# Patient Record
Sex: Male | Born: 1960 | Hispanic: Yes | Marital: Married | Smoking: Never smoker
Health system: Southern US, Community
[De-identification: ages and names within clinical notes are randomized; demographics above are authoritative.]

## PROBLEM LIST (undated history)

## (undated) DIAGNOSIS — I10 Essential (primary) hypertension: Secondary | ICD-10-CM

## (undated) DIAGNOSIS — G4730 Sleep apnea, unspecified: Secondary | ICD-10-CM

## (undated) DIAGNOSIS — R0981 Nasal congestion: Secondary | ICD-10-CM

## (undated) DIAGNOSIS — R030 Elevated blood-pressure reading, without diagnosis of hypertension: Principal | ICD-10-CM

## (undated) DIAGNOSIS — IMO0001 Reserved for inherently not codable concepts without codable children: Secondary | ICD-10-CM

## (undated) DIAGNOSIS — I639 Cerebral infarction, unspecified: Secondary | ICD-10-CM

## (undated) HISTORY — PX: COLONOSCOPY: SHX174

## (undated) HISTORY — DX: Reserved for inherently not codable concepts without codable children: IMO0001

## (undated) HISTORY — PX: HERNIA REPAIR: SHX51

## (undated) HISTORY — DX: Cerebral infarction, unspecified: I63.9

## (undated) HISTORY — DX: Elevated blood-pressure reading, without diagnosis of hypertension: R03.0

---

## 2005-01-28 ENCOUNTER — Emergency Department (HOSPITAL_COMMUNITY): Admission: EM | Admit: 2005-01-28 | Discharge: 2005-01-28 | Payer: Self-pay | Admitting: Emergency Medicine

## 2005-01-28 IMAGING — CT CT HEAD W/O CM
1 of 2 series · 14 of 30 positions shown, 18 images · IV contrast (agent unspecified)
Comparison: None

CLINICAL DATA: Fever, headache, nausea, vomiting

HEAD CT WITHOUT CONTRAST:
TECHNIQUE: 5mm collimated images were obtained from the base of the skull
through the vertex according to standard protocol without contrast.

[Series 2: brain · axial · 0.47mm/px · z∈[+149,+293]mm · 14 of 36 slices shown, 18 images]
[im 3/36  brain]
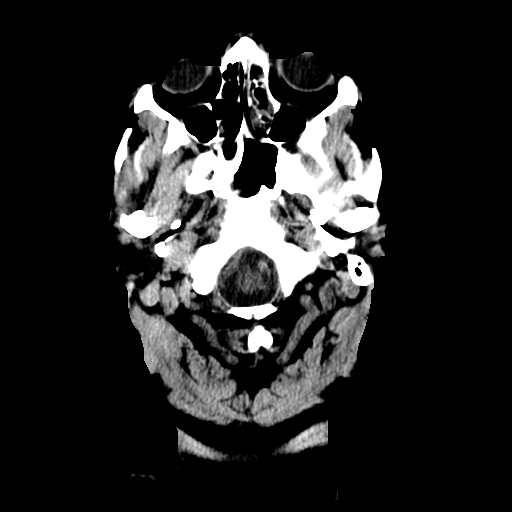
[im 3/36  bone]
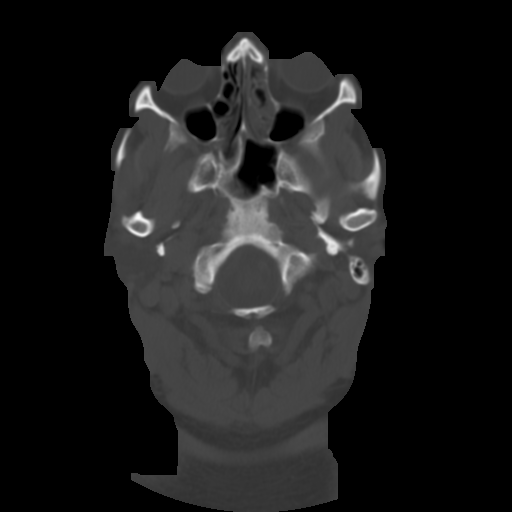
[im 5/36  brain]
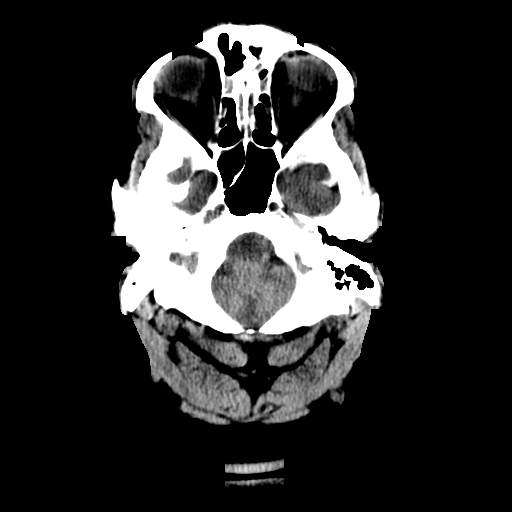
[im 8/36  brain]
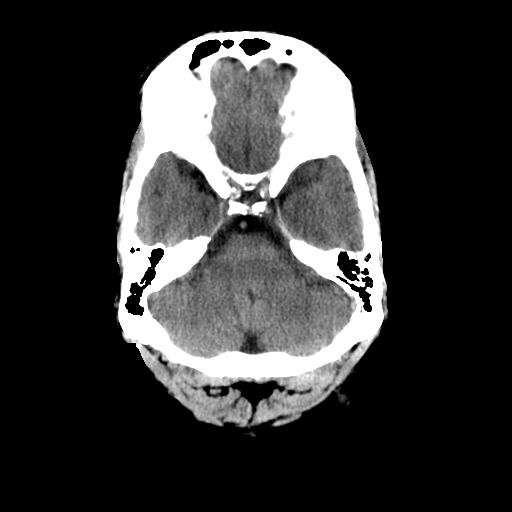
[im 10/36  brain]
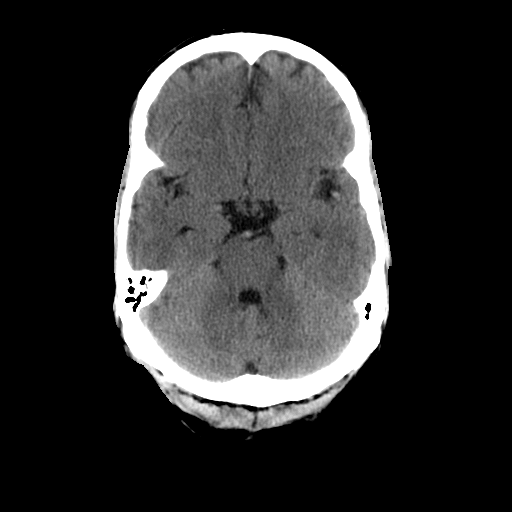
[im 12/36  brain]
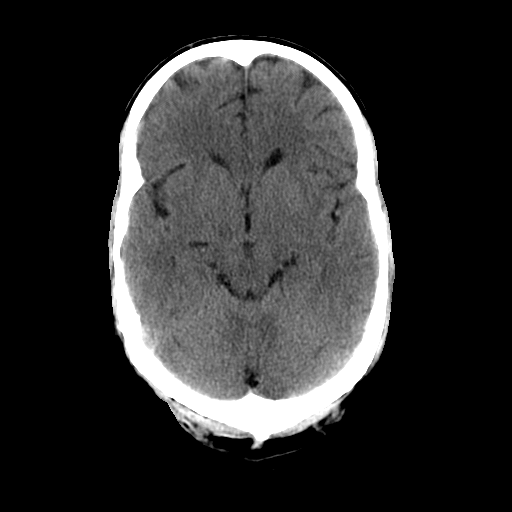
[im 12/36  bone]
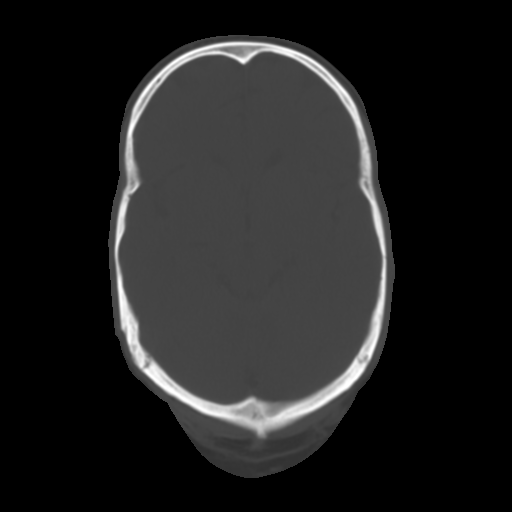
[im 15/36  brain]
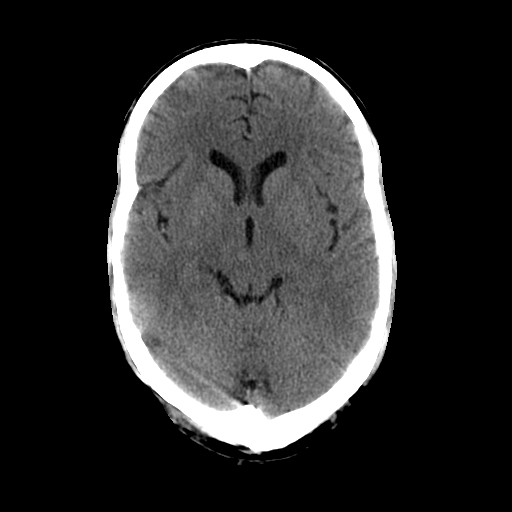
[im 17/36  brain]
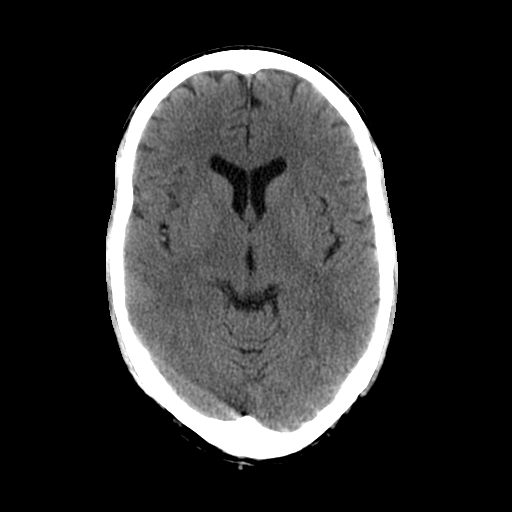
[im 19/36  brain]
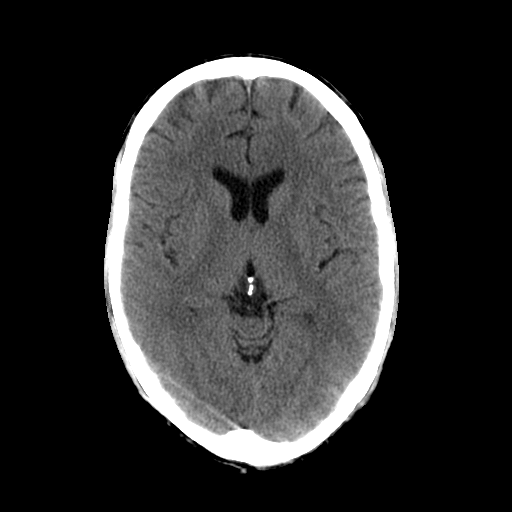
[im 22/36  brain]
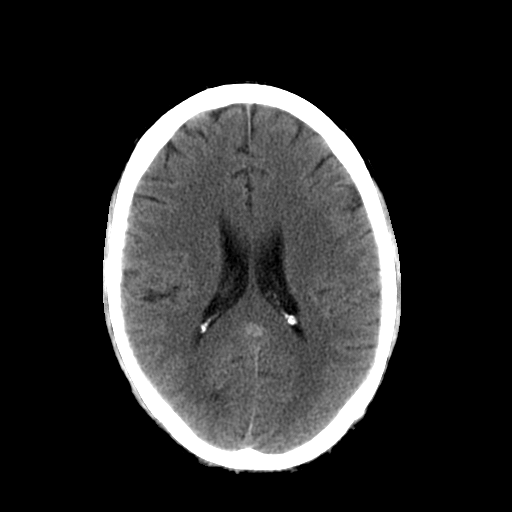
[im 22/36  bone]
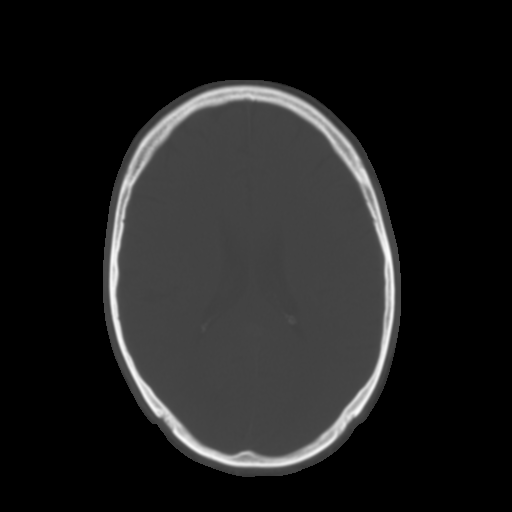
[im 24/36  brain]
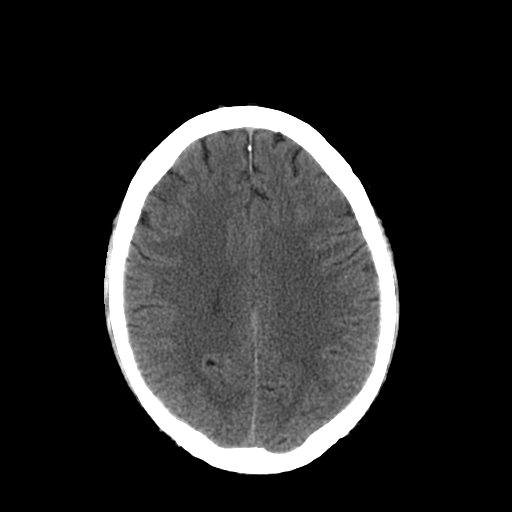
[im 26/36  brain]
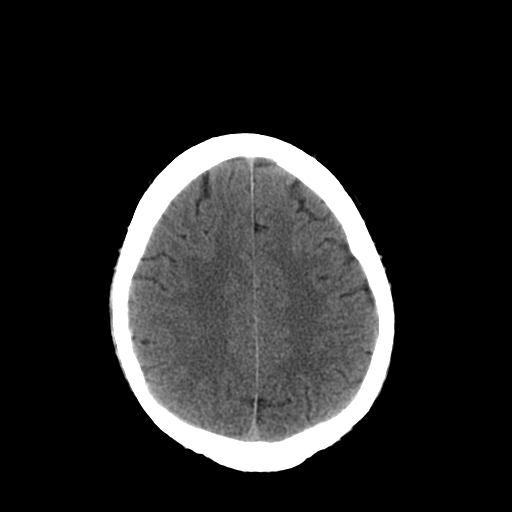
[im 29/36  brain]
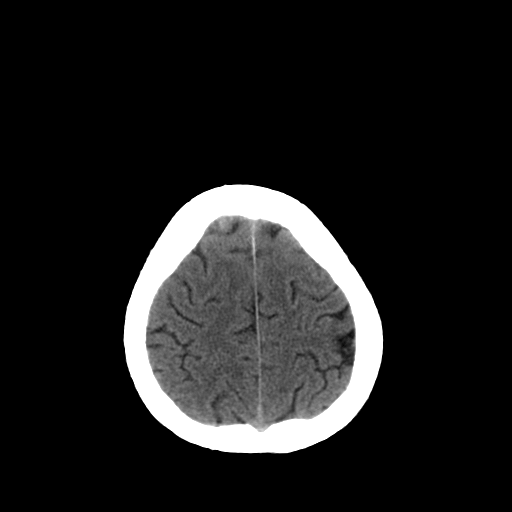
[im 31/36  brain]
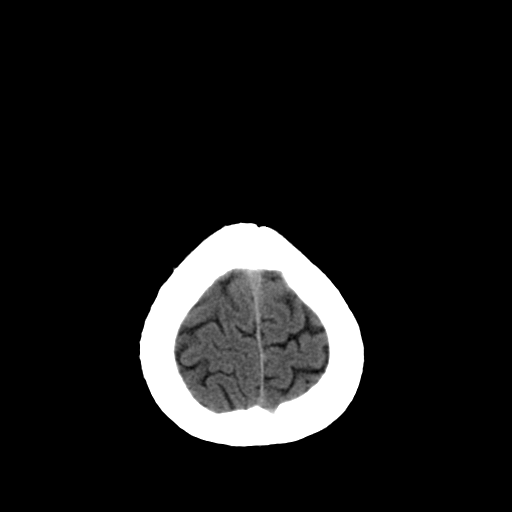
[im 31/36  bone]
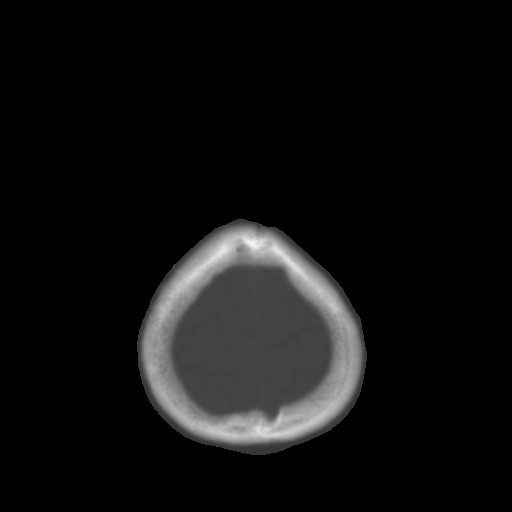
[im 33/36  brain]
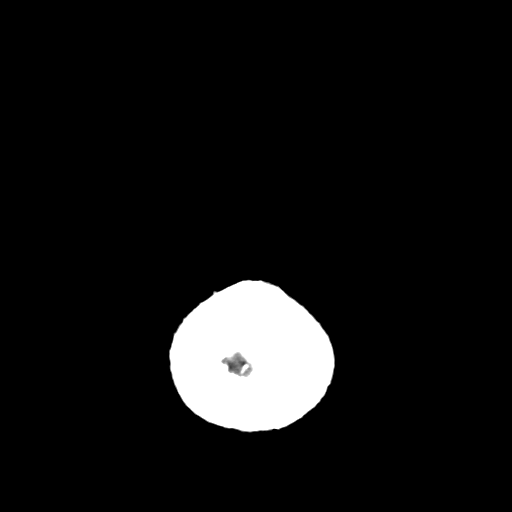

[14 of 30 positions shown; findings below may reference images not displayed]

FINDINGS: There is no evidence of intracranial hemorrhage, brain edema, or mass
effect.  No other intra-axial abnormalities are seen, and the ventricles are
within normal limits.  No abnormal extra-axial fluid collections or masses are
identified.  No skull abnormalities are noted.

Chronic sinusitis changes are noted in the visualized para-nasal sinuses.
IMPRESSION: No intracranial abnormality.

Chronic sinusitis.

## 2009-08-09 ENCOUNTER — Emergency Department (HOSPITAL_COMMUNITY): Admission: EM | Admit: 2009-08-09 | Discharge: 2009-08-09 | Payer: Self-pay | Admitting: Emergency Medicine

## 2009-08-09 IMAGING — CR DG FOOT COMPLETE 3+V*L*
3 series · 3 of 3 positions shown · non-contrast
Comparison: None.

CLINICAL DATA: Tree fell on the left foot today.  Pain in left
first through third toes.

LEFT FOOT - COMPLETE 3+ VIEW

[t foot ap left]
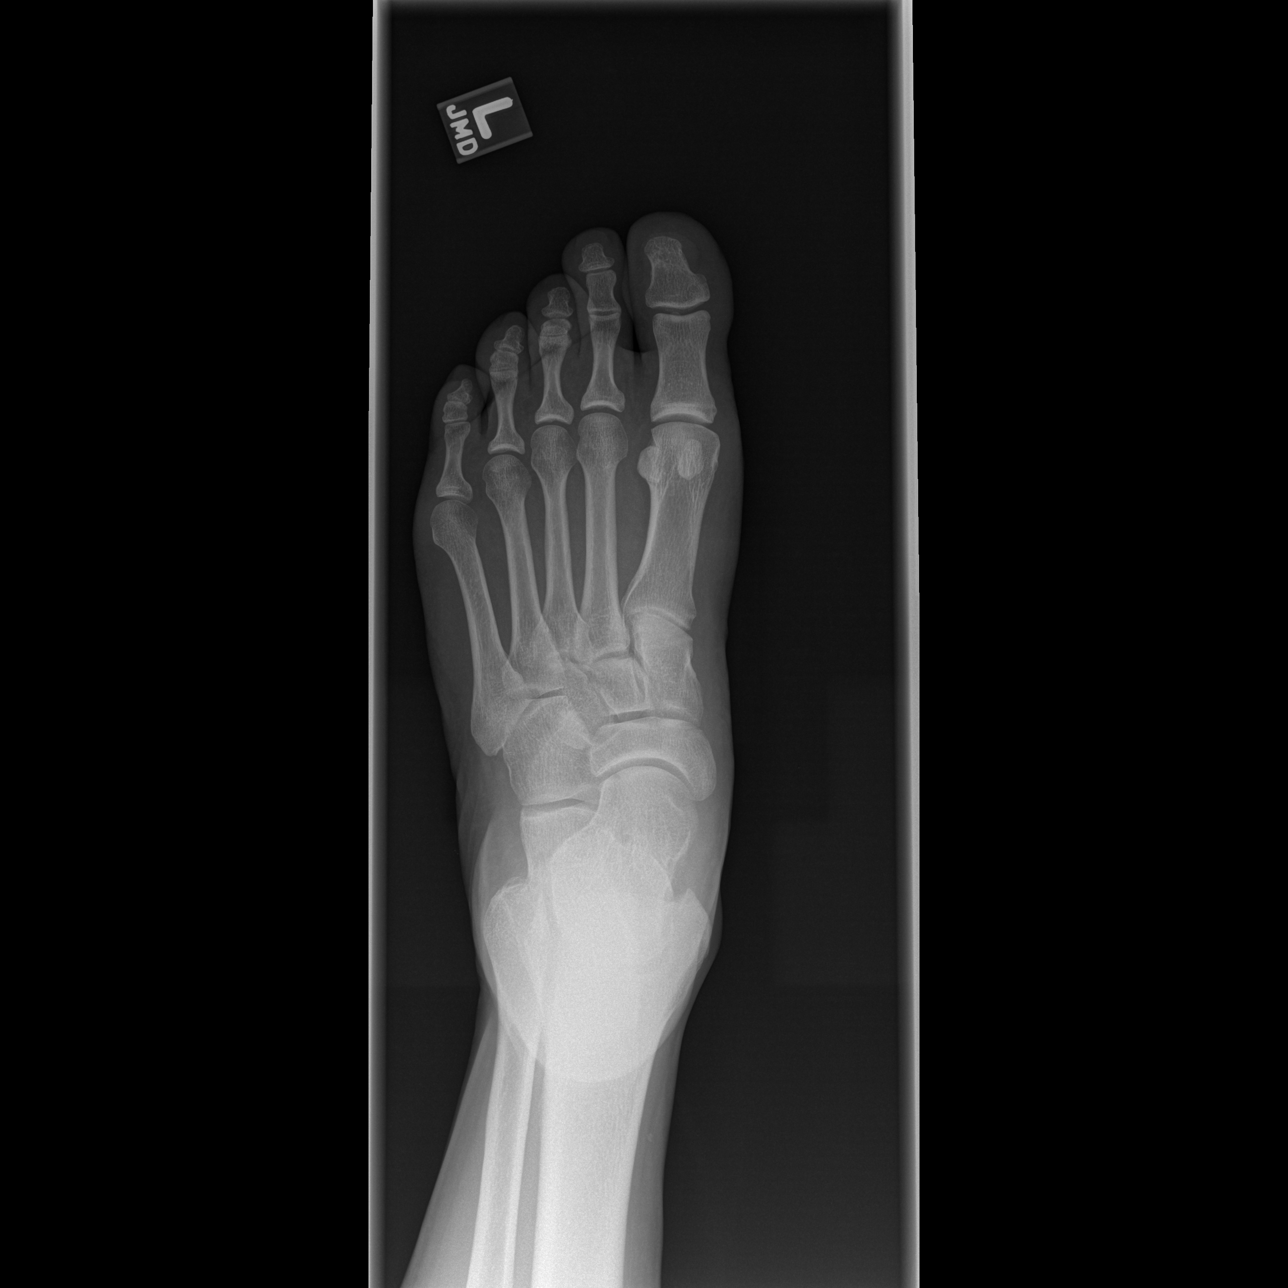

[t foot oblique left]
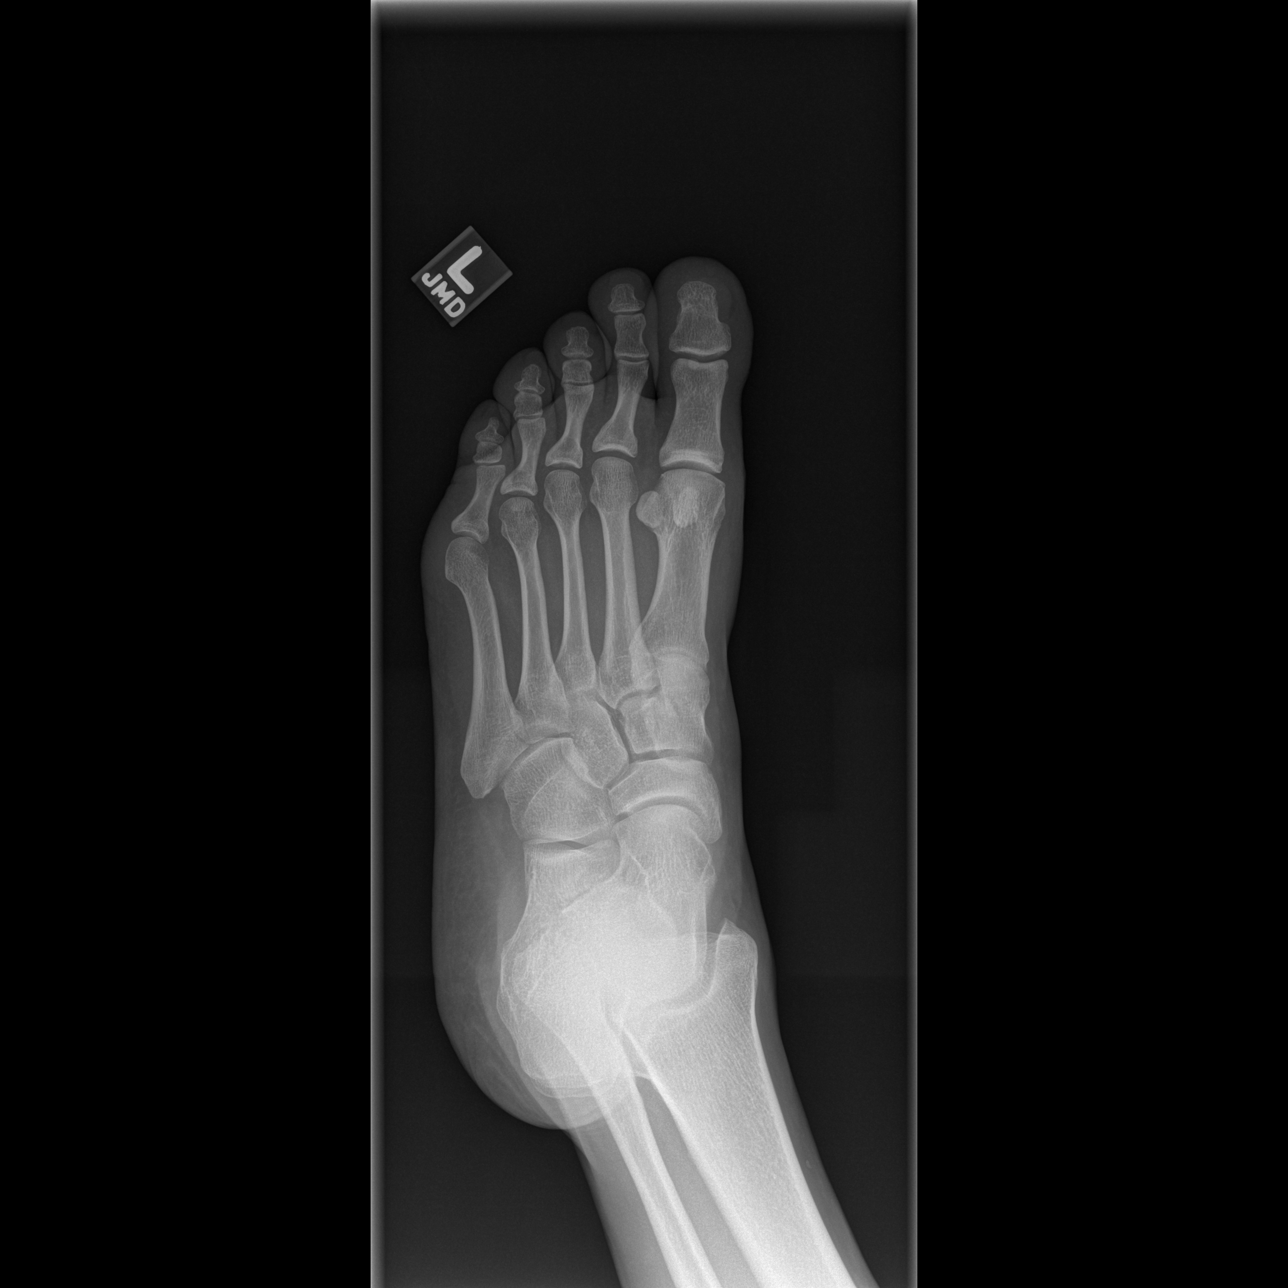

[t foot lat left]
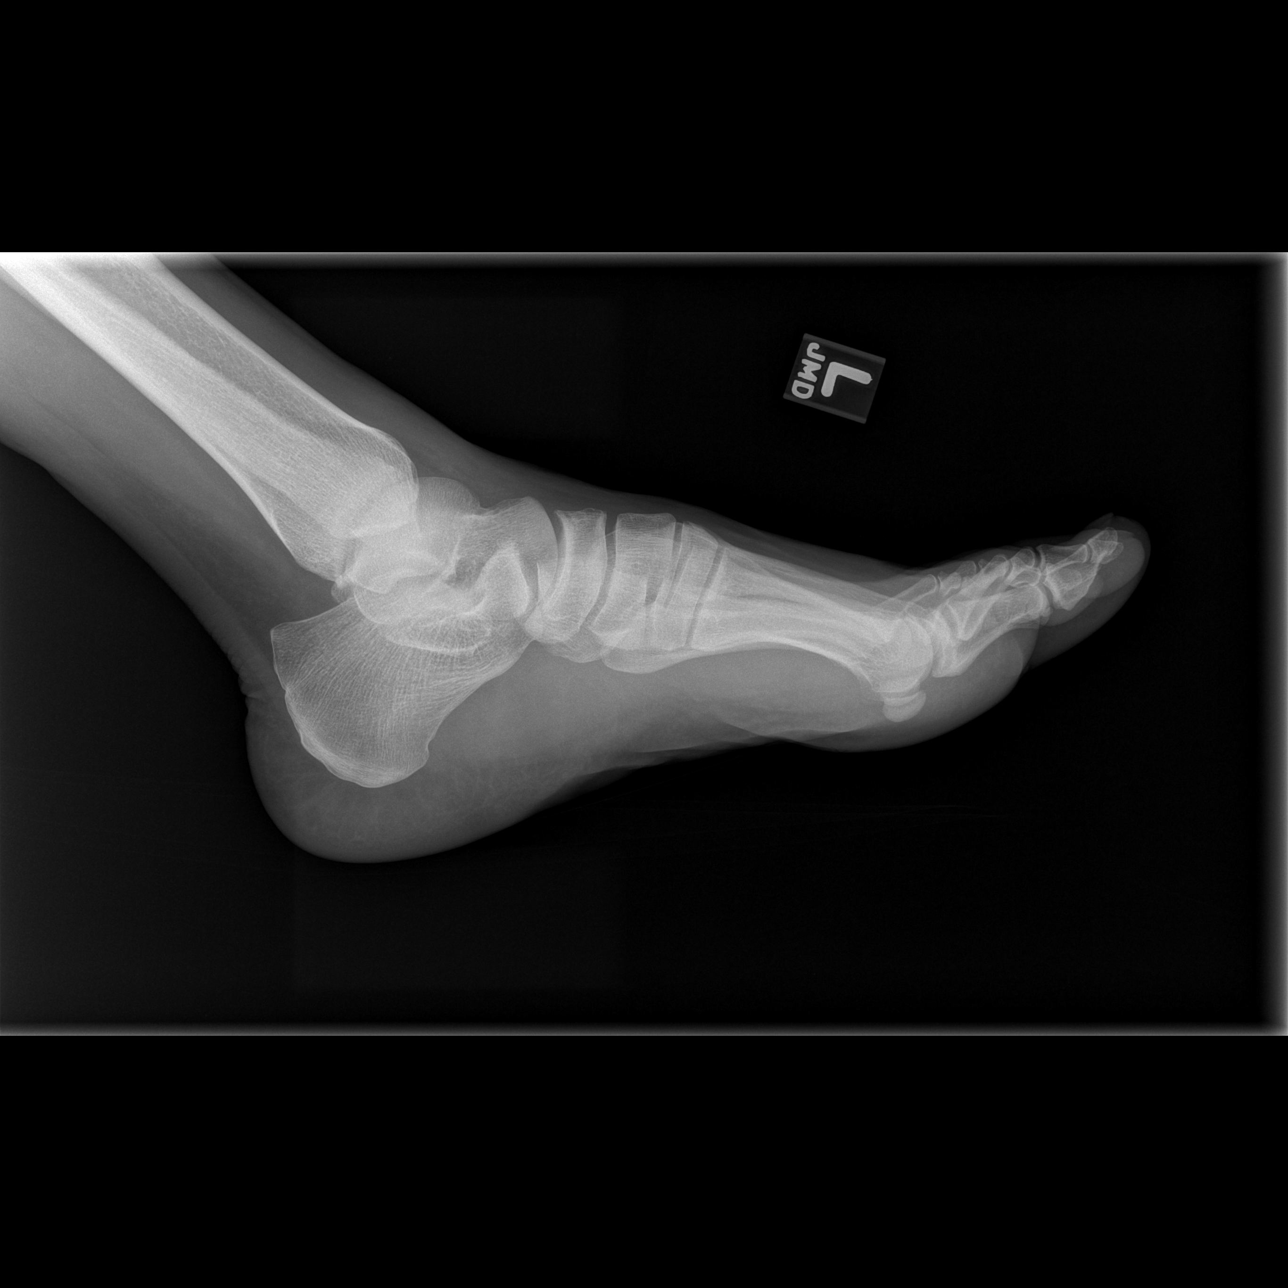

[3 of 3 positions shown; findings below may reference images not displayed]

FINDINGS: There is no evidence of fracture or dislocation.  There
is no evidence of arthropathy or other focal bony abnormality.
Soft tissues are unremarkable.
IMPRESSION: Negative.

## 2010-07-29 ENCOUNTER — Emergency Department (HOSPITAL_COMMUNITY)
Admission: EM | Admit: 2010-07-29 | Discharge: 2010-07-29 | Disposition: A | Discharge status: Home / Self Care | Payer: BC Managed Care – PPO | Attending: Emergency Medicine | Admitting: Emergency Medicine

## 2010-07-29 DIAGNOSIS — M5430 Sciatica, unspecified side: Secondary | ICD-10-CM | POA: Insufficient documentation

## 2010-07-29 DIAGNOSIS — M545 Low back pain, unspecified: Secondary | ICD-10-CM | POA: Insufficient documentation

## 2010-07-29 DIAGNOSIS — X500XXA Overexertion from strenuous movement or load, initial encounter: Secondary | ICD-10-CM | POA: Insufficient documentation

## 2010-07-31 ENCOUNTER — Encounter (INDEPENDENT_AMBULATORY_CARE_PROVIDER_SITE_OTHER): Payer: Self-pay | Admitting: *Deleted

## 2010-07-31 ENCOUNTER — Encounter: Payer: Self-pay | Admitting: Internal Medicine

## 2010-07-31 ENCOUNTER — Ambulatory Visit (INDEPENDENT_AMBULATORY_CARE_PROVIDER_SITE_OTHER): Payer: BC Managed Care – PPO | Admitting: Internal Medicine

## 2010-07-31 VITALS — BP 144/80 | HR 78 | Ht 67.25 in | Wt 161.6 lb

## 2010-07-31 DIAGNOSIS — M549 Dorsalgia, unspecified: Secondary | ICD-10-CM

## 2010-07-31 MED ORDER — KETOROLAC TROMETHAMINE 60 MG/2ML IJ SOLN: 60.0000 mg | INTRAMUSCULAR | Status: DC

## 2010-07-31 MED ORDER — ORPHENADRINE CITRATE 100 MG PO TB12: 100.0000 mg | ORAL_TABLET | Freq: Two times a day (BID) | ORAL | Status: DC

## 2010-07-31 MED ORDER — KETOROLAC TROMETHAMINE 60 MG/2ML IM SOLN
60.0000 mg | Freq: Once | INTRAMUSCULAR | Status: AC
  Administered 2010-07-31: 60 mg via INTRAMUSCULAR

## 2010-07-31 MED ORDER — OXYCODONE-ACETAMINOPHEN 5-325 MG PO TABS: 2.0000 | ORAL_TABLET | Freq: Four times a day (QID) | ORAL | Status: DC | PRN

## 2010-07-31 NOTE — Patient Instructions (Signed)
compresas calinentes en la [employer] medicinas tal como estan prescritas llame si se pone peor , tiene fiebre, no siente la pierna llame si no mejora en [employer]

## 2010-07-31 NOTE — Assessment & Plan Note (Addendum)
One-week history of left lower back pain which radiculopathy features. Neurological exam negative except for a positive straight leg test. Plan: Toradol Finish prednisone Warm compresses RF pain meds and muscle relaxants Referred to orthopedic surgery Will call if no better in one week or if he has more symptoms

## 2010-07-31 NOTE — Progress Notes (Signed)
  Subjective:    Patient ID: Manuel Peters, male    DOB: [birth year], 50 y.o.   MRN: [id number]  HPI New patient Was seen at the emergency room 3-28, complained of back pain, was prescribed prednisone . Pain started ~ 1 week ago, sudden onset while working in his yard Goes from the L Low back to the left leg, posteriorly. Meds help temporarily  No past medical history on file.  No past surgical history on file.   Review of Systems No fever, no rash Note nausea, vomiting, diarrhea No bladder or bowel incontinence Occasional tingling in the left leg on and off No history of previous back injuries No dysuria or gross hematuria    Objective:   Physical Exam Alert and oriented, some distress when he lays down in the table to be examined at Lungs are clear to auscultation bilateral CV: RRR without a murmur , extremities no edema Neurological exam: DTRs normal bilaterally, strength normal bilaterally. Straight leg test positive on the left.        Assessment & Plan:  Evaluated for back pain today. Notes from the ER reviewed  Will come back in one month for a physical exam

## 2010-08-04 NOTE — Letter (Signed)
Summary: Out of Work  [facility]  [address] [place], [place] [address]   Phone: [phone]  Fax: [phone]    July 31, 2010   Employee:  Manuel Peters    To Whom It May Concern:   For Medical reasons, please excuse the above named employee from work for the following dates:  Start:   07/29/10  End:   08/10/10  If you need additional information, please feel free to contact our office.         Sincerely,    [name], MD

## 2010-08-17 ENCOUNTER — Telehealth: Payer: Self-pay | Admitting: *Deleted

## 2010-08-17 NOTE — Telephone Encounter (Signed)
Pt needs a note to return to work- he was written out from 07/30/10-08/10/10. He is just now trying to go back to work today. Please advise if it is okay to give him an additional week- 4/9-4/16 for his work.

## 2010-08-20 ENCOUNTER — Encounter: Payer: Self-pay | Admitting: *Deleted

## 2010-08-20 NOTE — Telephone Encounter (Signed)
Message left for patient to return my call. Letter is upfront for pt.

## 2010-08-20 NOTE — Telephone Encounter (Signed)
Ok to do

## 2010-08-21 NOTE — Telephone Encounter (Signed)
Message left for patient to return my call.  

## 2010-08-31 ENCOUNTER — Ambulatory Visit (INDEPENDENT_AMBULATORY_CARE_PROVIDER_SITE_OTHER): Payer: BC Managed Care – PPO | Admitting: Internal Medicine

## 2010-08-31 ENCOUNTER — Encounter: Payer: Self-pay | Admitting: Internal Medicine

## 2010-08-31 DIAGNOSIS — M549 Dorsalgia, unspecified: Secondary | ICD-10-CM

## 2010-08-31 DIAGNOSIS — R030 Elevated blood-pressure reading, without diagnosis of hypertension: Secondary | ICD-10-CM

## 2010-08-31 NOTE — Assessment & Plan Note (Signed)
Essentially resolved. Will call if symptoms resurface

## 2010-08-31 NOTE — Progress Notes (Signed)
  Subjective:    Patient ID: Manuel Peters, male    DOB: 1960-12-25, 50 y.o.   MRN: [id number]  HPI  Was seen recently with back pain, was referred to orthopedic surgery but that never happened. 2 to 3 days after the last visit, he improved. Currently doing well, essentially no back pain. From time to time he feels some mild tingling in the left leg. Also, complaining of elevated blood pressure from time to time. He is ambulatory his are sometimes 130/90.  Past Medical History  Diagnosis Date  . Elevated BP    Past Surgical History  Procedure Date  . Hernia repair 1980s    inguinal    History   Social History  . Marital Status: Single    Spouse Name: N/A    Number of Children: N/A  . Years of Education: N/A   Occupational History  . Not on file.   Social History Main Topics  . Smoking status: Never Smoker   . Smokeless tobacco: Not on file  . Alcohol Use: Yes     rarely  . Drug Use: Not on file  . Sexually Active: Not on file   Other Topics Concern  . Not on file   Social History Narrative   Original from [place]    Review of Systems No fever, no bladder or bowel incontinence. He had some constipation while he was taking pain medicine but that is resolved. He has a healthy lifestyle, he is active, follows a low-fat diet soft diet. + loud  snoring      Objective:   Physical Exam  Constitutional: He is oriented to person, place, and time. He appears well-developed and well-nourished.  Cardiovascular: Normal rate, regular rhythm, normal heart sounds and intact distal pulses.   Pulmonary/Chest: Effort normal and breath sounds normal.  Musculoskeletal:       Not tender to palpation in the back  Neurological: He is alert and oriented to person, place, and time.       Lower extremity reflexes and strength symmetric. Negative straight leg test  Psychiatric: He has a normal mood and affect.          Assessment & Plan:

## 2010-08-31 NOTE — Assessment & Plan Note (Signed)
Moderately elevated BP, recommend to continue with his healthy life and if possible to improve it and lose some weight. Will ask the patient to monitor his BP regularly and call if it is elevated. Instructions provided in Spanish.

## 2010-08-31 NOTE — Patient Instructions (Signed)
Tome la presion 3 veces por semana, si esta mas de 140/85 la [employer] del tiempo: llamenos para una cita. regrese en 4 meses para un examen general

## 2013-04-20 ENCOUNTER — Encounter (HOSPITAL_BASED_OUTPATIENT_CLINIC_OR_DEPARTMENT_OTHER): Payer: Self-pay | Admitting: Emergency Medicine

## 2013-04-20 ENCOUNTER — Emergency Department (HOSPITAL_BASED_OUTPATIENT_CLINIC_OR_DEPARTMENT_OTHER)
Admission: EM | Admit: 2013-04-20 | Discharge: 2013-04-21 | Disposition: A | Discharge status: Home / Self Care | Payer: BC Managed Care – PPO | Attending: Emergency Medicine | Admitting: Emergency Medicine

## 2013-04-20 DIAGNOSIS — Z79899 Other long term (current) drug therapy: Secondary | ICD-10-CM | POA: Insufficient documentation

## 2013-04-20 DIAGNOSIS — E860 Dehydration: Secondary | ICD-10-CM

## 2013-04-20 DIAGNOSIS — I10 Essential (primary) hypertension: Secondary | ICD-10-CM | POA: Insufficient documentation

## 2013-04-20 DIAGNOSIS — Z7982 Long term (current) use of aspirin: Secondary | ICD-10-CM | POA: Insufficient documentation

## 2013-04-20 DIAGNOSIS — M6282 Rhabdomyolysis: Secondary | ICD-10-CM | POA: Insufficient documentation

## 2013-04-20 NOTE — ED Notes (Signed)
Pt states that about 30 minutes ago he was sleeping and woke up with sudden onset severe muscle cramping all over his body.  Pt recently started a new blood pressure medication and also states that he was out side working in the cold weather.

## 2013-04-21 LAB — COMPREHENSIVE METABOLIC PANEL
ALT: 23 U/L (ref 0–53)
Albumin: 3.9 g/dL (ref 3.5–5.2)
Alkaline Phosphatase: 49 U/L (ref 39–117)
BUN: 29 mg/dL — ABNORMAL HIGH (ref 6–23)
CO2: 28 mEq/L (ref 19–32)
Chloride: 100 mEq/L (ref 96–112)
GFR calc Af Amer: 70 mL/min — ABNORMAL LOW (ref >90)
Sodium: 141 mEq/L (ref 135–145)
Total Bilirubin: 0.6 mg/dL (ref 0.3–1.2)

## 2013-04-21 LAB — CBC WITH DIFFERENTIAL/PLATELET
Eosinophils Absolute: 0.2 10*3/uL (ref 0.0–0.7)
Eosinophils Relative: 2 % (ref 0–5)
Hemoglobin: 14 g/dL (ref 13.0–17.0)
Lymphs Abs: 1.7 10*3/uL (ref 0.7–4.0)
MCV: 85.7 fL (ref 78.0–100.0)
Monocytes Absolute: 0.9 10*3/uL (ref 0.1–1.0)
Neutrophils Relative %: 55 % (ref 43–77)
WBC: 6.2 10*3/uL (ref 4.0–10.5)

## 2013-04-21 MED ORDER — SODIUM CHLORIDE 0.9 % IV BOLUS (SEPSIS)
1000.0000 mL | Freq: Once | INTRAVENOUS | Status: AC
  Administered 2013-04-21: 1000 mL via INTRAVENOUS

## 2013-04-21 NOTE — ED Provider Notes (Signed)
CSN: [id number]     Arrival date & time 04/20/13  2328 History   First MD Initiated Contact with Patient 04/21/13 0009     Chief Complaint  Patient presents with  . Muscle Pain   (Consider location/radiation/quality/duration/timing/severity/associated sxs/prior Treatment) HPI Comments: Pt with hx of HTN, just started on Lis/HCTz combo 2 days ago, comes in with cc of myalgias. 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 Patient now pain free. Pt has no hx of similar sx. He deneis any n/v/f/c/diarrhea or current URI like sx.  Patient is a 52 y.o. male presenting with musculoskeletal pain. The history is provided by the patient and the spouse.  Muscle Pain Pertinent negatives include no chest pain, no headaches and no shortness of breath.    Past Medical History  Diagnosis Date  . Elevated BP    Past Surgical History  Procedure Laterality Date  . Hernia repair  1980s    inguinal    Family History  Problem Relation Age of Onset  . Hypertension      M, B   History  Substance Use Topics  . Smoking status: Never Smoker   . Smokeless tobacco: Never Used  . Alcohol Use: Yes     Comment: rarely    Review of Systems  Constitutional: Negative for fever, chills and activity change.  Eyes: Negative for visual disturbance.  Respiratory: Negative for cough, chest tightness and shortness of breath.   Cardiovascular: Negative for chest pain.  Gastrointestinal: Negative for abdominal distention.  Genitourinary: Negative for dysuria, enuresis and difficulty urinating.  Musculoskeletal: Positive for myalgias. Negative for arthralgias and neck pain.  Neurological: Negative for dizziness, light-headedness and headaches.  Psychiatric/Behavioral: Negative for confusion.    Allergies  Review of patient's allergies indicates no known  allergies.  Home Medications   Current Outpatient Rx  Name  Route  Sig  Dispense  Refill  . lisinopril-hydrochlorothiazide (PRINZIDE,ZESTORETIC) 20-25 MG per tablet   Oral   Take 1 tablet by mouth daily.         Marland Kitchen aspirin 81 MG tablet   Oral   Take 81 mg by mouth daily.            BP 140/92  Pulse 73  Temp(Src) 98.3 F (36.8 C) (Oral)  Resp 16  Ht 5\' 7"  (1.702 m)  Wt 170 lb (77.111 kg)  BMI 26.62 kg/m2  SpO2 96% Physical Exam  Constitutional: He is oriented to person, place, and time. He appears well-developed.  HENT:  Head: Normocephalic and atraumatic.  Eyes: Conjunctivae and EOM are normal. Pupils are equal, round, and reactive to light.  Neck: Normal range of motion. Neck supple.  Cardiovascular: Normal rate and regular rhythm.   Pulmonary/Chest: Effort normal and breath sounds normal.  Abdominal: Soft. Bowel sounds are normal. He exhibits no distension. There is no tenderness. There is no rebound and no guarding.  Musculoskeletal: Normal range of motion. He exhibits no edema and no tenderness.  Neurological: He is alert and oriented to person, place, and time.  Skin: Skin is warm.    ED Course  Procedures (including critical care time) Labs Review Labs Reviewed  CBC WITH DIFFERENTIAL - Abnormal; Notable for the following:    Monocytes Relative 14 (*)    All other components within normal limits  CK - Abnormal; Notable for  the following:    Total CK 455 (*)    All other components within normal limits  COMPREHENSIVE METABOLIC PANEL - Abnormal; Notable for the following:    BUN 29 (*)    GFR calc non Af Amer 61 (*)    GFR calc Af Amer 70 (*)    All other components within normal limits  TROPONIN I   Imaging Review No results found.  EKG Interpretation   None       MDM  No diagnosis found.  Pt comes in with cc of myalgias - crampy pain, diffuse, for < 10 minutes. Patient started on new meds - side effect profile checked - and besides  electrlyte imbalance, and dehydration - nothing really coming up that could be related to his sx. CPK is slightly elevated Pt admits to not drinking much fluids during work. Will hydrate. He is symptoms free outside of that 1 episode, and is stable for discharge.    Derwood Kaplan, MD 04/21/13 [phone]

## 2014-06-20 DIAGNOSIS — J309 Allergic rhinitis, unspecified: Secondary | ICD-10-CM | POA: Insufficient documentation

## 2014-06-20 DIAGNOSIS — I10 Essential (primary) hypertension: Secondary | ICD-10-CM | POA: Insufficient documentation

## 2014-07-10 ENCOUNTER — Encounter (HOSPITAL_BASED_OUTPATIENT_CLINIC_OR_DEPARTMENT_OTHER): Payer: Self-pay | Admitting: *Deleted

## 2014-07-10 NOTE — Progress Notes (Signed)
Wife has cell phone-states pt does not need interpreter-he can understand-daughter also coming-trying to get notes from dr valesquez-will need istat-ekg Added on 430pm

## 2014-07-11 ENCOUNTER — Ambulatory Visit (HOSPITAL_BASED_OUTPATIENT_CLINIC_OR_DEPARTMENT_OTHER): Payer: 59 | Admitting: Anesthesiology

## 2014-07-11 ENCOUNTER — Ambulatory Visit (HOSPITAL_BASED_OUTPATIENT_CLINIC_OR_DEPARTMENT_OTHER)
Admission: RE | Admit: 2014-07-11 | Discharge: 2014-07-11 | Disposition: A | Discharge status: Home / Self Care | Payer: 59 | Source: Ambulatory Visit | Attending: Otolaryngology | Admitting: Otolaryngology

## 2014-07-11 ENCOUNTER — Encounter (HOSPITAL_BASED_OUTPATIENT_CLINIC_OR_DEPARTMENT_OTHER)
Admission: RE | Disposition: A | Discharge status: Home / Self Care | Payer: Self-pay | Source: Ambulatory Visit | Attending: Otolaryngology

## 2014-07-11 ENCOUNTER — Encounter (HOSPITAL_BASED_OUTPATIENT_CLINIC_OR_DEPARTMENT_OTHER): Payer: Self-pay | Admitting: *Deleted

## 2014-07-11 DIAGNOSIS — J343 Hypertrophy of nasal turbinates: Secondary | ICD-10-CM | POA: Diagnosis not present

## 2014-07-11 DIAGNOSIS — G4730 Sleep apnea, unspecified: Secondary | ICD-10-CM | POA: Insufficient documentation

## 2014-07-11 DIAGNOSIS — J338 Other polyp of sinus: Secondary | ICD-10-CM | POA: Insufficient documentation

## 2014-07-11 DIAGNOSIS — J342 Deviated nasal septum: Secondary | ICD-10-CM | POA: Diagnosis present

## 2014-07-11 DIAGNOSIS — Z7982 Long term (current) use of aspirin: Secondary | ICD-10-CM | POA: Insufficient documentation

## 2014-07-11 DIAGNOSIS — I10 Essential (primary) hypertension: Secondary | ICD-10-CM | POA: Insufficient documentation

## 2014-07-11 DIAGNOSIS — J339 Nasal polyp, unspecified: Secondary | ICD-10-CM | POA: Diagnosis present

## 2014-07-11 DIAGNOSIS — J329 Chronic sinusitis, unspecified: Secondary | ICD-10-CM | POA: Diagnosis not present

## 2014-07-11 HISTORY — DX: Sleep apnea, unspecified: G47.30

## 2014-07-11 HISTORY — PX: NASAL SEPTOPLASTY W/ TURBINOPLASTY: SHX2070

## 2014-07-11 HISTORY — DX: Essential (primary) hypertension: I10

## 2014-07-11 HISTORY — DX: Nasal congestion: R09.81

## 2014-07-11 HISTORY — PX: SINUS ENDO W/FUSION: SHX777

## 2014-07-11 LAB — POCT I-STAT, CHEM 8
BUN: 28 mg/dL — AB (ref 6–23)
CHLORIDE: 101 mmol/L (ref 96–112)
CREATININE: 0.9 mg/dL (ref 0.50–1.35)
Calcium, Ion: 1.18 mmol/L (ref 1.12–1.23)
Glucose, Bld: 76 mg/dL (ref 70–99)
HEMATOCRIT: 48 % (ref 39.0–52.0)
Hemoglobin: 16.3 g/dL (ref 13.0–17.0)
Potassium: 5.4 mmol/L — ABNORMAL HIGH (ref 3.5–5.1)
SODIUM: 137 mmol/L (ref 135–145)
TCO2: 28 mmol/L (ref 0–100)

## 2014-07-11 SURGERY — SEPTOPLASTY, NOSE, WITH NASAL TURBINATE REDUCTION
Anesthesia: General | Laterality: Bilateral

## 2014-07-11 MED ORDER — HYDROMORPHONE HCL 1 MG/ML IJ SOLN
INTRAMUSCULAR | Status: AC
  Filled 2014-07-11: qty 1

## 2014-07-11 MED ORDER — FENTANYL CITRATE 0.05 MG/ML IJ SOLN
INTRAMUSCULAR | Status: DC | PRN
  Administered 2014-07-11: 100 ug via INTRAVENOUS

## 2014-07-11 MED ORDER — CEFAZOLIN SODIUM-DEXTROSE 2-3 GM-% IV SOLR
INTRAVENOUS | Status: AC
  Filled 2014-07-11: qty 50

## 2014-07-11 MED ORDER — DEXAMETHASONE SODIUM PHOSPHATE 10 MG/ML IJ SOLN: 10.0000 mg | Freq: Once | INTRAMUSCULAR | Status: DC

## 2014-07-11 MED ORDER — LACTATED RINGERS IV SOLN
INTRAVENOUS | Status: DC
  Administered 2014-07-11 (×2): via INTRAVENOUS

## 2014-07-11 MED ORDER — HYDROMORPHONE HCL 1 MG/ML IJ SOLN
0.2500 mg | INTRAMUSCULAR | Status: DC | PRN
  Administered 2014-07-11: 0.5 mg via INTRAVENOUS

## 2014-07-11 MED ORDER — OXYCODONE HCL 5 MG PO TABS: 5.0000 mg | ORAL_TABLET | Freq: Once | ORAL | Status: DC | PRN

## 2014-07-11 MED ORDER — MUPIROCIN 2 % EX OINT
TOPICAL_OINTMENT | CUTANEOUS | Status: DC | PRN
  Administered 2014-07-11: 1 via NASAL

## 2014-07-11 MED ORDER — SUCCINYLCHOLINE CHLORIDE 20 MG/ML IJ SOLN
INTRAMUSCULAR | Status: DC | PRN
  Administered 2014-07-11: 100 mg via INTRAVENOUS

## 2014-07-11 MED ORDER — PROPOFOL 10 MG/ML IV BOLUS
INTRAVENOUS | Status: DC | PRN
  Administered 2014-07-11: 200 mg via INTRAVENOUS

## 2014-07-11 MED ORDER — CEFAZOLIN SODIUM-DEXTROSE 2-3 GM-% IV SOLR
2000.0000 mg | Freq: Once | INTRAVENOUS | Status: AC
  Administered 2014-07-11: 2 g via INTRAVENOUS

## 2014-07-11 MED ORDER — ONDANSETRON HCL 4 MG/2ML IJ SOLN
INTRAMUSCULAR | Status: DC | PRN
  Administered 2014-07-11: 4 mg via INTRAVENOUS

## 2014-07-11 MED ORDER — MIDAZOLAM HCL 2 MG/2ML IJ SOLN: 1.0000 mg | INTRAMUSCULAR | Status: DC | PRN

## 2014-07-11 MED ORDER — PROMETHAZINE HCL 25 MG/ML IJ SOLN: 6.2500 mg | INTRAMUSCULAR | Status: DC | PRN

## 2014-07-11 MED ORDER — FENTANYL CITRATE 0.05 MG/ML IJ SOLN
INTRAMUSCULAR | Status: AC
  Filled 2014-07-11: qty 4

## 2014-07-11 MED ORDER — AMOXICILLIN-POT CLAVULANATE 500-125 MG PO TABS: 1.0000 | ORAL_TABLET | Freq: Two times a day (BID) | ORAL | Status: DC

## 2014-07-11 MED ORDER — DEXAMETHASONE SODIUM PHOSPHATE 4 MG/ML IJ SOLN
INTRAMUSCULAR | Status: DC | PRN
  Administered 2014-07-11: 10 mg via INTRAVENOUS

## 2014-07-11 MED ORDER — HYDROCODONE-ACETAMINOPHEN 5-325 MG PO TABS
1.0000 | ORAL_TABLET | Freq: Four times a day (QID) | ORAL | Status: DC | PRN
Start: 2014-07-11 — End: 2020-05-30

## 2014-07-11 MED ORDER — MIDAZOLAM HCL 5 MG/5ML IJ SOLN
INTRAMUSCULAR | Status: DC | PRN
  Administered 2014-07-11: 2 mg via INTRAVENOUS

## 2014-07-11 MED ORDER — OXYMETAZOLINE HCL 0.05 % NA SOLN
NASAL | Status: DC | PRN
  Administered 2014-07-11: 1 via NASAL

## 2014-07-11 MED ORDER — EPHEDRINE SULFATE 50 MG/ML IJ SOLN
INTRAMUSCULAR | Status: DC | PRN
  Administered 2014-07-11 (×3): 10 mg via INTRAVENOUS

## 2014-07-11 MED ORDER — LIDOCAINE-EPINEPHRINE 1 %-1:100000 IJ SOLN
INTRAMUSCULAR | Status: DC | PRN
  Administered 2014-07-11: 10 mL

## 2014-07-11 MED ORDER — FENTANYL CITRATE 0.05 MG/ML IJ SOLN: 50.0000 ug | INTRAMUSCULAR | Status: DC | PRN

## 2014-07-11 MED ORDER — MIDAZOLAM HCL 2 MG/2ML IJ SOLN
INTRAMUSCULAR | Status: AC
  Filled 2014-07-11: qty 2

## 2014-07-11 MED ORDER — LIDOCAINE HCL (CARDIAC) 20 MG/ML IV SOLN
INTRAVENOUS | Status: DC | PRN
  Administered 2014-07-11: 50 mg via INTRAVENOUS

## 2014-07-11 MED ORDER — OXYCODONE HCL 5 MG/5ML PO SOLN: 5.0000 mg | Freq: Once | ORAL | Status: DC | PRN

## 2014-07-11 SURGICAL SUPPLY — 72 items
ATTRACTOMAT 16X20 MAGNETIC DRP (DRAPES) IMPLANT
BLADE RAD40 ROTATE 4M 4 5PK (BLADE) IMPLANT
BLADE RAD40 ROTATE 4M 4MM 5PK (BLADE)
BLADE RAD60 ROTATE M4 4 5PK (BLADE) IMPLANT
BLADE RAD60 ROTATE M4 4MM 5PK (BLADE)
BLADE ROTATE RAD 12 4 M4 (BLADE) IMPLANT
BLADE ROTATE RAD 12 4MM M4 (BLADE)
BLADE ROTATE RAD 40 4 M4 (BLADE) ×1 IMPLANT
BLADE ROTATE RAD 40 4MM M4 (BLADE) ×1
BLADE ROTATE TRICUT 4MX13CM M4 (BLADE) ×1
BLADE ROTATE TRICUT 4X13 M4 (BLADE) ×2 IMPLANT
BLADE SURG 15 STRL LF DISP TIS (BLADE) IMPLANT
BLADE SURG 15 STRL SS (BLADE)
BLADE TRICUT ROTATE M4 4 5PK (BLADE) IMPLANT
BLADE TRICUT ROTATE M4 4MM 5PK (BLADE)
BUR HS RAD FRONTAL 3 (BURR) IMPLANT
BUR HS RAD FRONTAL 3MM (BURR)
CANISTER SUC SOCK COL 7IN (MISCELLANEOUS) ×3 IMPLANT
CANISTER SUCT 1200ML W/VALVE (MISCELLANEOUS) ×6 IMPLANT
COAGULATOR SUCT 6 FR SWTCH (ELECTROSURGICAL) ×1
COAGULATOR SUCT 8FR VV (MISCELLANEOUS) IMPLANT
COAGULATOR SUCT SWTCH 10FR 6 (ELECTROSURGICAL) ×1 IMPLANT
DECANTER SPIKE VIAL GLASS SM (MISCELLANEOUS) IMPLANT
DRAPE SURG 17X23 STRL (DRAPES) ×3 IMPLANT
DRESSING NASAL KENNEDY 3.5X.9 (MISCELLANEOUS) IMPLANT
DRSG NASAL KENNEDY 3.5X.9 (MISCELLANEOUS)
DRSG NASOPORE 8CM (GAUZE/BANDAGES/DRESSINGS) IMPLANT
DRSG TELFA 3X8 NADH (GAUZE/BANDAGES/DRESSINGS) IMPLANT
ELECT COATED BLADE 2.86 ST (ELECTRODE) IMPLANT
ELECT REM PT RETURN 9FT ADLT (ELECTROSURGICAL) ×3
ELECTRODE REM PT RTRN 9FT ADLT (ELECTROSURGICAL) IMPLANT
GLOVE BIOGEL M 7.0 STRL (GLOVE) ×6 IMPLANT
GLOVE BIOGEL PI IND STRL 7.0 (GLOVE) IMPLANT
GLOVE BIOGEL PI INDICATOR 7.0 (GLOVE) ×2
GLOVE ECLIPSE 6.5 STRL STRAW (GLOVE) ×2 IMPLANT
GOWN STRL REUS W/ TWL LRG LVL3 (GOWN DISPOSABLE) ×2 IMPLANT
GOWN STRL REUS W/TWL LRG LVL3 (GOWN DISPOSABLE) ×6
IV NS 1000ML (IV SOLUTION)
IV NS 1000ML BAXH (IV SOLUTION) IMPLANT
IV NS 500ML (IV SOLUTION)
IV NS 500ML BAXH (IV SOLUTION) ×1 IMPLANT
IV NS IRRIG 3000ML ARTHROMATIC (IV SOLUTION) ×2 IMPLANT
NDL PRECISIONGLIDE 27X1.5 (NEEDLE) ×1 IMPLANT
NEEDLE PRECISIONGLIDE 27X1.5 (NEEDLE) ×3 IMPLANT
NS IRRIG 1000ML POUR BTL (IV SOLUTION) IMPLANT
PACK BASIN DAY SURGERY FS (CUSTOM PROCEDURE TRAY) ×3 IMPLANT
PACK ENT DAY SURGERY (CUSTOM PROCEDURE TRAY) ×3 IMPLANT
PAD DRESSING TELFA 3X8 NADH (GAUZE/BANDAGES/DRESSINGS) IMPLANT
PAD ENT ADHESIVE 25PK (MISCELLANEOUS) ×3 IMPLANT
PENCIL BUTTON HOLSTER BLD 10FT (ELECTRODE) IMPLANT
SET EXT MALE ROTATING LL 32IN (MISCELLANEOUS) ×3 IMPLANT
SET IV EXT TUBING FEMALE 31 (MISCELLANEOUS) ×1 IMPLANT
SLEEVE SCD COMPRESS KNEE MED (MISCELLANEOUS) IMPLANT
SOLUTION BUTLER CLEAR DIP (MISCELLANEOUS) ×3 IMPLANT
SPLINT NASAL AIRWAY SILICONE (MISCELLANEOUS) ×3 IMPLANT
SPONGE GAUZE 2X2 8PLY STER LF (GAUZE/BANDAGES/DRESSINGS) ×1
SPONGE GAUZE 2X2 8PLY STRL LF (GAUZE/BANDAGES/DRESSINGS) ×2 IMPLANT
SPONGE NEURO XRAY DETECT 1X3 (DISPOSABLE) ×3 IMPLANT
SPONGE SURGIFOAM ABS GEL 12-7 (HEMOSTASIS) IMPLANT
SUT ETHILON 3 0 PS 1 (SUTURE) ×3 IMPLANT
SUT PLAIN 4 0 ~~LOC~~ 1 (SUTURE) ×3 IMPLANT
SUT SILK 3 0 PS 1 (SUTURE) IMPLANT
SYR 3ML 23GX1 SAFETY (SYRINGE) IMPLANT
TOWEL OR 17X24 6PK STRL BLUE (TOWEL DISPOSABLE) ×6 IMPLANT
TRACKER ENT INSTRUMENT (MISCELLANEOUS) ×3 IMPLANT
TRACKER ENT PATIENT (MISCELLANEOUS) ×3 IMPLANT
TUBE CONNECTING 20'X1/4 (TUBING) ×1
TUBE CONNECTING 20X1/4 (TUBING) ×2 IMPLANT
TUBE SALEM SUMP 12R W/ARV (TUBING) IMPLANT
TUBE SALEM SUMP 16 FR W/ARV (TUBING) ×2 IMPLANT
TUBING STRAIGHTSHOT EPS 5PK (TUBING) ×3 IMPLANT
YANKAUER SUCT BULB TIP NO VENT (SUCTIONS) ×3 IMPLANT

## 2014-07-11 NOTE — Progress Notes (Signed)
Dr singer Trudi Idawavied  ekg

## 2014-07-11 NOTE — H&P (Signed)
Manuel Peters is an 54 y.o. male.   Chief Complaint: nasal obstruction and chronic sinusitis HPI: Prog sx of nasal obstruction and cong  Past Medical History  Diagnosis Date  . Elevated BP   . Hypertension   . Sinus congestion   . Sleep apnea     does not use a cpap    Past Surgical History  Procedure Laterality Date  . Hernia repair  1980s    inguinal   . Colonoscopy      Family History  Problem Relation Age of Onset  . Hypertension      M, B   Social History:  reports that he has never smoked. He has never used smokeless tobacco. He reports that he drinks alcohol. He reports that he does not use illicit drugs.  Allergies: No Known Allergies  Medications Prior to Admission  Medication Sig Dispense Refill  . amLODipine (NORVASC) 5 MG tablet Take 5 mg by mouth daily.    . fluticasone (FLONASE) 50 MCG/ACT nasal spray Place into both nostrils daily.    Marland Kitchen. lisinopril-hydrochlorothiazide (PRINZIDE,ZESTORETIC) 20-25 MG per tablet Take 1 tablet by mouth daily.    Marland Kitchen. aspirin 81 MG tablet Take 81 mg by mouth daily.        Results for orders placed or performed during the hospital encounter of 07/11/14 (from the past 48 hour(s))  I-STAT, chem 8     Status: Abnormal   Collection Time: 07/11/14  8:58 AM  Result Value Ref Range   Sodium 137 135 - 145 mmol/L   Potassium 5.4 (H) 3.5 - 5.1 mmol/L   Chloride 101 96 - 112 mmol/L   BUN 28 (H) 6 - 23 mg/dL   Creatinine, Ser 1.300.90 0.50 - 1.35 mg/dL   Glucose, Bld 76 70 - 99 mg/dL   Calcium, Ion 8.651.18 7.841.12 - 1.23 mmol/L   TCO2 28 0 - 100 mmol/L   Hemoglobin 16.3 13.0 - 17.0 g/dL   HCT 69.648.0 29.539.0 - 28.452.0 %   No results found.  Review of Systems  Constitutional: Negative.   HENT: Positive for congestion.   Respiratory: Negative.   Cardiovascular: Negative.   Gastrointestinal: Negative.   Neurological: Positive for headaches.    Blood pressure 147/90, pulse 62, temperature 98.7 F (37.1 C), resp. rate 20, height 5\' 6"  (1.676 m),  weight 78.189 kg (172 lb 6 oz), SpO2 99 %. Physical Exam  Constitutional: He is oriented to person, place, and time. He appears well-developed and well-nourished.  HENT:  Nose: Septal deviation present.  Neck: Normal range of motion. Neck supple.  Cardiovascular: Normal rate.   Respiratory: Effort normal.  GI: Soft.  Musculoskeletal: Normal range of motion.  Neurological: He is alert and oriented to person, place, and time.     Assessment/Plan Adm for OP septo, IT reduction and ESS.  Lezlee Gills 07/11/2014, 10:09 AM

## 2014-07-11 NOTE — Anesthesia Postprocedure Evaluation (Signed)
  Anesthesia Post-op Note  Patient: Nile RiggsJulio Cesar Guhl  Procedure(s) Performed: Procedure(s): NASAL SEPTOPLASTY WITH BILATERAL INFERIOR TURBINATE REDUCTION (Bilateral) BILATERAL ENDOSCOPIC SINUS SURGERY/RESECTION OF INTRA NASAL POLYPS WITH FUSION SCAN (Bilateral)  Patient Location: PACU  Anesthesia Type: General ETT   Level of Consciousness: awake, alert  and oriented  Airway and Oxygen Therapy: Patient Spontanous Breathing  Post-op Pain: mild  Post-op Assessment: Post-op Vital signs reviewed  Post-op Vital Signs: Reviewed  Last Vitals:  Filed Vitals:   07/11/14 1345  BP: 139/92  Pulse: 88  Temp: 36.7 C  Resp: 16    Complications: No apparent anesthesia complications

## 2014-07-11 NOTE — Anesthesia Preprocedure Evaluation (Addendum)
Anesthesia Evaluation  Patient identified by MRN, date of birth, ID band Patient awake    Reviewed: Allergy & Precautions, H&P , NPO status , Patient's Chart, lab work & pertinent test results  Airway Mallampati: II  TM Distance: >3 FB Neck ROM: Full    Dental  (+) Chipped, Dental Advisory Given   Pulmonary sleep apnea ,    Pulmonary exam normal       Cardiovascular hypertension, On Medications negative cardio ROS      Neuro/Psych negative neurological ROS  negative psych ROS   GI/Hepatic negative GI ROS, Neg liver ROS,   Endo/Other  negative endocrine ROS  Renal/GU negative Renal ROS     Musculoskeletal   Abdominal   Peds  Hematology   Anesthesia Other Findings   Reproductive/Obstetrics negative OB ROS                            Anesthesia Physical Anesthesia Plan  ASA: II  Anesthesia Plan: General ETT   Post-op Pain Management:    Induction: Intravenous  Airway Management Planned: Oral ETT  Additional Equipment:   Intra-op Plan:   Post-operative Plan: Extubation in OR  Informed Consent: I have reviewed the patients History and Physical, chart, labs and discussed the procedure including the risks, benefits and alternatives for the proposed anesthesia with the patient or authorized representative who has indicated his/her understanding and acceptance.   Dental advisory given  Plan Discussed with: CRNA, Anesthesiologist and Surgeon  Anesthesia Plan Comments:        Anesthesia Quick Evaluation

## 2014-07-11 NOTE — Anesthesia Procedure Notes (Signed)
Procedure Name: Intubation Date/Time: 07/11/2014 10:37 AM Performed by: Caren MacadamARTER, Meila Berke W Pre-anesthesia Checklist: Patient identified, Emergency Drugs available, Suction available and Patient being monitored Patient Re-evaluated:Patient Re-evaluated prior to inductionOxygen Delivery Method: Circle System Utilized Preoxygenation: Pre-oxygenation with 100% oxygen Intubation Type: IV induction Ventilation: Mask ventilation without difficulty Laryngoscope Size: Miller and 2 Grade View: Grade I Tube type: Oral Tube size: 8.0 mm Number of attempts: 1 Airway Equipment and Method: Stylet and Oral airway Placement Confirmation: ETT inserted through vocal cords under direct vision,  positive ETCO2 and breath sounds checked- equal and bilateral Secured at: 23 cm Tube secured with: Tape Dental Injury: Teeth and Oropharynx as per pre-operative assessment

## 2014-07-11 NOTE — Brief Op Note (Signed)
07/11/2014  12:43 PM  PATIENT:  Nile RiggsJulio Cesar Dunigan  54 y.o. male  PRE-OPERATIVE DIAGNOSIS:  CHRONIC SINUSITIS/DEVIATED NASAL SEPTUM  POST-OPERATIVE DIAGNOSIS:  chronic sinusitis/deviated nasal septum  PROCEDURE:  1.Bil ESS with Fusion  2. Endo nasal polypectomy  3. Septoplasty  4. Inf turbinate reduction  SURGEON:  Surgeon(s) and Role:    * Osborn Cohoavid Liberta Gimpel, MD - Primary  PHYSICIAN ASSISTANT:   ASSISTANTS: none   ANESTHESIA:   general  EBL:  Total I/O In: 1500 [I.V.:1500] Out: - 200cc   BLOOD ADMINISTERED:none  DRAINS: none   LOCAL MEDICATIONS USED:  LIDOCAINE  and Amount: 8 ml  SPECIMEN:  Source of Specimen:  sinus contents  DISPOSITION OF SPECIMEN:  PATHOLOGY  COUNTS:  YES  TOURNIQUET:  * No tourniquets in log *  DICTATION: .Other Dictation: Dictation Number [id number]  PLAN OF CARE: Discharge to home after PACU  PATIENT DISPOSITION:  PACU - hemodynamically stable.   Delay start of Pharmacological VTE agent (>24hrs) due to surgical blood loss or risk of bleeding: not applicable

## 2014-07-11 NOTE — Discharge Instructions (Signed)

## 2014-07-11 NOTE — Transfer of Care (Signed)
Immediate Anesthesia Transfer of Care Note  Patient: Manuel Peters  Procedure(s) Performed: Procedure(s): NASAL SEPTOPLASTY WITH BILATERAL INFERIOR TURBINATE REDUCTION (Bilateral) BILATERAL ENDOSCOPIC SINUS SURGERY/RESECTION OF INTRA NASAL POLYPS WITH FUSION SCAN (Bilateral)  Patient Location: PACU  Anesthesia Type:General  Level of Consciousness: awake, sedated and patient cooperative  Airway & Oxygen Therapy: Patient Spontanous Breathing and aerosol face mask  Post-op Assessment: Report given to RN and Post -op Vital signs reviewed and stable  Post vital signs: Reviewed and stable  Last Vitals:  Filed Vitals:   07/11/14 1249  BP:   Pulse: 94  Temp:   Resp: 35    Complications: No apparent anesthesia complications

## 2014-07-12 NOTE — Op Note (Signed)
Manuel Peters, Manuel Peters NO.:  [id number]  MEDICAL RECORD NO.:  [id number]  LOCATION:                                 FACILITY:  PHYSICIAN:  Kinnie Scales. Annalee Genta, M.D.DATE OF BIRTH:  March 22, 1961  DATE OF PROCEDURE:  07/11/2014 DATE OF DISCHARGE:  07/11/2014                              OPERATIVE REPORT   PREOPERATIVE DIAGNOSES: 1. Chronic sinusitis with nasal polyposis. 2. Deviated nasal septum with nasal airway obstruction. 3. Inferior turbinate hypertrophy.  POSTOPERATIVE DIAGNOSES: 1. Chronic sinusitis with nasal polyposis. 2. Deviated nasal septum with nasal airway obstruction. 3. Inferior turbinate hypertrophy.  INDICATIONS FOR SURGERY: 1. Chronic sinusitis with nasal polyposis. 2. Deviated nasal septum with nasal airway obstruction. 3. Inferior turbinate hypertrophy.  SURGICAL PROCEDURES: 1. Bilateral endoscopic sinus surgery with intraoperative computer-     assisted navigation (Fusion) consisting of left total     ethmoidectomy, left maxillary antrostomy with removal of diseased     tissue and left nasal frontal recess exploration, right anterior     ethmoidectomy, right maxillary antrostomy with removal of diseased     tissue, and right nasal frontal recess exploration. 2. Endoscopic nasal polypectomy. 3. Nasal septoplasty. 4. Bilateral inferior turbinate reduction.  ANESTHESIA:  General endotracheal.  SURGEON:  Kinnie Scales. Annalee Genta, M.D.  COMPLICATIONS:  None.  ESTIMATED BLOOD LOSS:  Less than 200 mL.  DISPOSITION:  The patient was transferred from the operating room to the recovery room in stable condition.  FINDINGS:  Severe nasal septal deviation with right nasal airway obstruction.  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  Bilateral nasal septal splints placed at the conclusion of the surgical procedure, no packing was  placed.  BRIEF HISTORY:  The patient is a 54 year old male who was referred to our office with progressive symptoms of nasal airway obstruction.  He had a history of nasal trauma as a young man and reported significant long-standing issues with nasal blockage.  Over the last several years, he has had increasing symptoms of congestion and pressure with fullness in the sinuses and postnasal discharge.  He had been treated with appropriate medical therapy including antibiotics, saline nasal irrigation, and topical nasal steroids.  CT scan of the sinuses was obtained, which showed soft tissue disease filling the entire left nasal passageway and nasopharynx, consistent with an antral choanal polyp, extending from the sinuses.  Some diffuse polypoid mucosal disease and a severely deviated nasal septum.  Given the patient's history and findings, I recommended the above surgical procedures.  The risks and benefits of these procedures were discussed in detail, and the patient and his family understood and agreed with our plan for surgery which is scheduled on elective basis at the [facility] on July 11, 2014.  DESCRIPTION OF PROCEDURE:  The patient was brought to the operating room, placed in supine position on the operating table.  General endotracheal anesthesia was established without difficulty.  When the patient was adequately anesthetized, he was positioned on the operating table and prepped and draped.  His nose was then injected with a  total of 8 mL of 1% lidocaine with 1:100,000 solution of epinephrine which was injected in submucosal fashion along the nasal septum, lateral nasal wall, and inferior turbinates bilaterally.  His nose was then packed with Afrin-soaked cottonoid pledgets and were left in place for approximately 10 minutes to allow for vasoconstriction hemostasis.  The Xomed Fusion headgear was applied and the anatomic and surgical landmarks  were identified and confirmed.  The navigation device was used throughout the endoscopic sinus component of the surgical procedure.  The patient was adequately prepared for surgery.  Time-out was obtained and the surgical procedure was begun with left nasal endoscopy.  The patient had a large soft tissue mass consistent with a polyp extending from the middle posterior aspect of the middle meatus into the nasal cavity and nasal choanae, completely obstructing the nasopharynx.  Using a 0-degree telescope and a straight microdebrider, the entire polyp was resected, creating a widely patent nasopharynx and nasal choanae. Dissection was carried to the level of the ethmoid sinus and endoscopic sinus surgery was then undertaken.  The left middle turbinate was carefully medialized.  The uncinate processes were identified, reflected anteriorly and resected, and a total ethmoidectomy was performed from anterior to posterior along the floor of the ethmoid sinus.  Bony septations were removed as well as thick diseased mucosa and polypoid material.  The posterior superior aspect of the ethmoid sinus identified and a 45 degree telescope was used along the roof of the sinus from posterior to anterior, dissecting bony septations and removing diseased mucosa.  Nasal frontal recess was identified and there was significant thickened abnormal mucosal tissue in the anterior ethmoid and nasal frontal recess which was resected. The frontal sinus is widely patent at the conclusion of the procedure. Attention was then turned to the lateral nasal wall where the natural ostium of the maxillary sinus was identified.  The residual uncinate process was resected and the sinus ostium was enlarged in an inferior and posterior direction to create a patent sinus ostium.  Within the sinus, there was a moderate amount of polypoid material which was fully resected with a curved microdebrider.  Attention was then turned to  the patient's nasal septal deviation.  Left anterior hemitransfixion incision was created and mucoperichondrial flap was elevated from anterior to posterior.  The patient has severely deviated bony septum.  This was carefully mobilized with a through- cutting forceps and blunt and sharp dissection, preserving the overlying mucosal membranes.  Deviated bone and cartilage in the mid and posterior aspects of the nasal septum were resected.  The anterior septal cartilage was removed.  This was later morselized and returned to the mucoperichondrial pocket.  The septum brought to good midline position. Cartilage was returned.  Mucoperichondrial flaps were reapproximated with a 4-0 gut suture on a Keith needle in a horizontal mattress fashion and at the conclusion of the surgical procedure, bilateral Doyle nasal septal splints were placed after the application of Bactroban ointment and sutured in position with a 3-0 Ethilon suture.  The patient's right side was then inspected and endoscopic sinus surgery was begun by medializing the right middle turbinate.  There was no significant polypoid disease on the right-hand side.  Anterior ethmoidectomy was performed with careful dissection of the ethmoid bulla from inferior to superior.  The nasal frontal recess was identified and underlying ethmoid air cells were removed with a 45-degree telescope and a curved microdebrider.  Again, there was thickened polypoid mucosa at the level of the nasal frontal recess.  This was fully resected, creating a widely patent nasal frontal recess.  There was a  small amount of bleeding from the anterior ethmoid artery which was cauterized with monopolar suction cautery at 12 watts.  No significant bleeding later on in the procedure.  Attention was then turned to the lateral nasal wall where the uncinate process reflected anteriorly and resected. The patient had a large posterior accessory sinus ostium.   Intervening soft tissue was then removed and the natural ostium was incorporated to create a widely patent ostium.  Again, there was polypoid material within the right maxillary sinus which was resected, creating a widely patent sinus without evidence of any active infection.  Bilateral inferior turbinate reduction was then performed with cautery set at 12 watts.  Two submucosal passes were made in each inferior turbinate.  When the turbinates were adequately cauterized, anterior incisions were created, overlying soft tissue elevated.  Small amount of turbinate bone was resected and the turbinates were then outfractured, creating more patent nasal cavity.  The patient's nasal cavity was carefully inspected and cleared of surgical debris.  There was no active bleeding.  A small amount of MeroGel sponge was placed in each anterior ethmoid cavities, stabilized middle turbinates.  The Doyle septal splints were sutured into position with a 3-0 Ethilon suture.  The oral cavity was irrigated and suctioned. Orogastric tube was passed.  Stomach contents were aspirated.  The nasal sponge count was correct.  The patient was then awakened from his anesthetic.  He was extubated and then transferred from the operating room to the recovery room in stable condition.  There were no complications.  Estimated blood loss was less than 200 mL.          ______________________________ Kinnie Scalesavid L. Annalee GentaShoemaker, M.D.     DLS/MEDQ  D:  16/10/960403/02/2015  T:  07/12/2014  Job:  [id number]

## 2014-07-15 ENCOUNTER — Encounter (HOSPITAL_BASED_OUTPATIENT_CLINIC_OR_DEPARTMENT_OTHER): Payer: Self-pay | Admitting: Otolaryngology

## 2015-01-03 ENCOUNTER — Encounter (HOSPITAL_BASED_OUTPATIENT_CLINIC_OR_DEPARTMENT_OTHER): Payer: Self-pay | Admitting: *Deleted

## 2015-01-03 ENCOUNTER — Emergency Department (HOSPITAL_BASED_OUTPATIENT_CLINIC_OR_DEPARTMENT_OTHER)
Admission: EM | Admit: 2015-01-03 | Discharge: 2015-01-03 | Disposition: A | Discharge status: Home / Self Care | Payer: 59 | Attending: Emergency Medicine | Admitting: Emergency Medicine

## 2015-01-03 DIAGNOSIS — I10 Essential (primary) hypertension: Secondary | ICD-10-CM | POA: Insufficient documentation

## 2015-01-03 DIAGNOSIS — Z792 Long term (current) use of antibiotics: Secondary | ICD-10-CM | POA: Insufficient documentation

## 2015-01-03 DIAGNOSIS — Z8669 Personal history of other diseases of the nervous system and sense organs: Secondary | ICD-10-CM | POA: Diagnosis not present

## 2015-01-03 DIAGNOSIS — M545 Low back pain: Secondary | ICD-10-CM | POA: Diagnosis present

## 2015-01-03 DIAGNOSIS — Z79899 Other long term (current) drug therapy: Secondary | ICD-10-CM | POA: Insufficient documentation

## 2015-01-03 DIAGNOSIS — M5431 Sciatica, right side: Secondary | ICD-10-CM | POA: Insufficient documentation

## 2015-01-03 MED ORDER — IBUPROFEN 600 MG PO TABS: 600.0000 mg | ORAL_TABLET | Freq: Four times a day (QID) | ORAL | Status: DC | PRN

## 2015-01-03 MED ORDER — CYCLOBENZAPRINE HCL 10 MG PO TABS: 10.0000 mg | ORAL_TABLET | Freq: Two times a day (BID) | ORAL | Status: DC | PRN

## 2015-01-03 NOTE — ED Notes (Signed)
Pt c/o right lower back pain x2-3 days with it becoming worse today. Pt stst that the pain radiates down his right leg also. Pt denies injury.

## 2015-01-03 NOTE — ED Notes (Signed)
Patient denies injury or trauma

## 2015-01-03 NOTE — ED Provider Notes (Signed)
CSN: [id number]     Arrival date & time 01/03/15  4098 History   First MD Initiated Contact with Patient 01/03/15 [phone]     Chief Complaint  Patient presents with  . Back Pain     (Consider location/radiation/quality/duration/timing/severity/associated sxs/prior Treatment) Patient is a 54 y.o. male presenting with back pain.  Back Pain Location:  Lumbar spine Quality:  Aching, stabbing and shooting Radiates to: R leg. Pain severity:  Severe Pain is:  Same all the time Onset quality:  Gradual Duration:  3 days Timing:  Constant Progression:  Worsening Chronicity:  Recurrent Context: jumping from heights   Relieved by:  Nothing Worsened by:  Movement, touching and twisting Associated symptoms: leg pain   Associated symptoms: no abdominal pain, no bladder incontinence, no bowel incontinence, no dysuria, no fever, no numbness, no paresthesias and no perianal numbness     Past Medical History  Diagnosis Date  . Elevated BP   . Hypertension   . Sinus congestion   . Sleep apnea     does not use a cpap   Past Surgical History  Procedure Laterality Date  . Hernia repair  1980s    inguinal   . Colonoscopy    . Nasal septoplasty w/ turbinoplasty Bilateral 07/11/2014    Procedure: NASAL SEPTOPLASTY WITH BILATERAL INFERIOR TURBINATE REDUCTION;  Surgeon: Osborn Coho, MD;  Location: [facility];  Service: ENT;  Laterality: Bilateral;  . Sinus endo w/fusion Bilateral 07/11/2014    Procedure: BILATERAL ENDOSCOPIC SINUS SURGERY/RESECTION OF INTRA NASAL POLYPS WITH FUSION SCAN;  Surgeon: Osborn Coho, MD;  Location: [facility];  Service: ENT;  Laterality: Bilateral;   Family History  Problem Relation Age of Onset  . Hypertension      M, B   Social History  Substance Use Topics  . Smoking status: Never Smoker   . Smokeless tobacco: Never Used  . Alcohol Use: Yes     Comment: rarely    Review of Systems  Constitutional: Negative for fever.   Gastrointestinal: Negative for abdominal pain and bowel incontinence.  Genitourinary: Negative for bladder incontinence and dysuria.  Musculoskeletal: Positive for back pain.  Neurological: Negative for numbness and paresthesias.  All other systems reviewed and are negative.     Allergies  Review of patient's allergies indicates no known allergies.  Home Medications   Prior to Admission medications   Medication Sig Start Date End Date Taking? Authorizing Provider  lisinopril-hydrochlorothiazide (PRINZIDE,ZESTORETIC) 20-25 MG per tablet Take 1 tablet by mouth daily.   Yes Historical Provider, MD  amLODipine (NORVASC) 5 MG tablet Take 5 mg by mouth daily.    Historical Provider, MD  amoxicillin-clavulanate (AUGMENTIN) 500-125 MG per tablet Take 1 tablet (500 mg total) by mouth 2 (two) times daily. 07/11/14   Osborn Coho, MD  cyclobenzaprine (FLEXERIL) 10 MG tablet Take 1 tablet (10 mg total) by mouth 2 (two) times daily as needed for muscle spasms. 01/03/15   Mirian Mo, MD  HYDROcodone-acetaminophen (NORCO) 5-325 MG per tablet Take 1-2 tablets by mouth every 6 (six) hours as needed. 07/11/14   Osborn Coho, MD  ibuprofen (ADVIL,MOTRIN) 600 MG tablet Take 1 tablet (600 mg total) by mouth every 6 (six) hours as needed. 01/03/15   Mirian Mo, MD   BP 159/88 mmHg  Pulse 60  Temp(Src) 98.6 F (37 C) (Oral)  Resp 20  Ht 5\' 6"  (1.676 m)  Wt 170 lb (77.111 kg)  BMI 27.45 kg/m2  SpO2 100% Physical Exam  Constitutional: He is oriented to person, place, and time. He appears well-developed and well-nourished.  HENT:  Head: Normocephalic and atraumatic.  Eyes: Conjunctivae and EOM are normal.  Neck: Normal range of motion. Neck supple.  Cardiovascular: Normal rate, regular rhythm and normal heart sounds.   Pulmonary/Chest: Effort normal and breath sounds normal. No respiratory distress.  Abdominal: He exhibits no distension. There is no tenderness. There is no rebound and no  guarding.  Musculoskeletal: Normal range of motion.       Lumbar back: He exhibits tenderness. He exhibits no bony tenderness.  Straight leg positive on R  Neurological: He is alert and oriented to person, place, and time.  Skin: Skin is warm and dry.  Vitals reviewed.   ED Course  Procedures (including critical care time) Labs Review Labs Reviewed - No data to display  Imaging Review No results found. I have personally reviewed and evaluated these images and lab results as part of my medical decision-making.   EKG Interpretation None      MDM   Final diagnoses:  Sciatica, right    54 y.o. male with pertinent PMH of prior back pain, HTN presents with back pain after lifting a sledgehammer for work recently.  No acute trauma.  Exam and history consistent with sciatica.  No neuro deficits by history or on exam.  No dysuria or systemic symptoms.  DC home with standard return precautions.    I have reviewed all laboratory and imaging studies if ordered as above  1. Sciatica, right         Mirian Mo, MD 01/03/15 1011

## 2015-01-03 NOTE — Discharge Instructions (Signed)
Dolor de espalda en el adulto °(Back Pain, Adult) ° El dolor de cintura es frecuente. Aproximadamente 1 de cada 5 personas lo sufren. La causa rara vez pone en peligro la vida. Con frecuencia mejora luego de algún tiempo. Alrededor de la mitad de las personas que sufren un inicio súbito de dolor de cintura, se sentirán mejor luego de 2 semanas. Aproximadamente 8 de cada 10 se sentirán mejor luego de 6 semanas.  °CAUSAS  °Algunas causas comunes son:  °· Distensión de los músculos o ligamentos que sostienen la columna vertebral. °· Desgaste (degeneración) de los discos vertebrales. °· Artritis. °· Traumatismos directos en la espalda. °DIAGNÓSTICO  °La mayor parte de las veces, la causa directa no se conoce. Sin embargo, el dolor puede tratarse efectivamente aún cuando no se conozca la causa. Una de las formas más precisas de asegurar que la causa del dolor no constituye un peligro es responder a las preguntas del médico acerca de su salud y sus síntomas. Si el médico necesita más información, podrá indicar análisis de laboratorio o realizar un diagnóstico por imágenes (radiografías o resonancia magnética). Sin embargo, aunque las imágenes muestren modificaciones, generalmente no es necesaria la cirugía.  °INSTRUCCIONES PARA EL CUIDADO EN EL HOGAR  °En algunas personas, el dolor de espalda vuelve. Como rara vez es peligroso, los pacientes pueden aprender a manejarlo ellos mismos.  °· Manténgase activo. Si permanece sentado o de pie mucho tiempo en el mismo lugar, se tensiona la espalda.  No se siente, maneje ni se quede parado en un mismo lugar por más de 30 minutos. Realice caminatas cortas en superficies planas ni bien el dolor haya cedido. Trate de aumentar cada día el tiempo que camina . °· No se quede en la cama. Si hace reposo durante más de 1 o 2 días, puede retrasar la recuperación. °· No evite los ejercicios ni el trabajo. El cuerpo está hecho para moverse. No es peligroso estar activo, aunque le duela la  espalda. La espalda se curará más rápido si continúa sus actividades antes de que el dolor se vaya. °· Preste atención a su cuerpo cuando se incline y se levante. Muchas personas sienten menos molestias cuando levantan objetos si doblan las rodillas, mantienen la carga cerca del cuerpo y evitan torcerse. Generalmente, las posiciones más cómodas son las que ejercen menos tensión en la espalda en recuperación. °· Encuentre una posición cómoda para dormir. Utilice un colchón firme y recuéstese de costado. Doble ligeramente sus rodillas. Si se recuesta sobre su espalda, coloque una almohada debajo de sus rodillas. °· Tome sólo medicamentos de venta libre o recetados, según las indicaciones del médico. Los medicamentos de venta libre para calmar el dolor y reducir la inflamación, son los que en general más ayudan. El médico podrá prescribirle relajantes musculares. Estos medicamentos calman el dolor de modo que pueda retornar a sus actividades normales y a realizar ejercicios saludables. °· Aplique hielo sobre la zona lesionada. °¨ Ponga el hielo en una bolsa plástica. °¨ Colóquese una toalla entre la piel y la bolsa de hielo. °¨ Deje la bolsa de hielo durante 15 a 20 minutos 3 a 4 veces por día, durante los primeros 2 ó 3 días. Luego podrá alternar entre calor y hielo para reducir el dolor y los espasmos. °· Consulte a su médico si puede tratar de hacer ejercicios para la espalda y recibir un masaje suave. Pueden ser beneficiosos. °· Evite sentirse ansioso o estresado. El estrés aumenta la tensión muscular y puede empeorar el dolor de espalda. Es importante reconocer cuando está ansioso o estresado y aprender la forma   de controlarlos. El ejercicio es una gran opción. °SOLICITE ATENCIÓN MÉDICA SI:  °· Siente un dolor que no se alivia con reposo o medicamentos. °· El dolor no mejora en 1 semana. °· Desarrolla nuevos síntomas. °· No se siente bien en general. °SOLICITE ATENCIÓN MÉDICA DE INMEDIATO SI: °· Siente un dolor  que se irradia desde la espalda hacia sus piernas. °· Desarrolla nuevos problemas en el intestino o la vejiga. °· Siente debilidad o adormecimiento inusual en sus brazos o piernas. °· Presenta náuseas o vómitos. °· Presenta dolor abdominal. °· Se siente desfalleciente. °Document Released: 04/19/2005 Document Revised: 10/19/2011 °ExitCare® Patient Information ©2015 [employer]. This information is not intended to replace advice given to you by your health care provider. Make sure you discuss any questions you have with your health care provider. ° °

## 2015-01-03 NOTE — ED Notes (Signed)
Patient being transported by brother-in-law.  Patient has taken to Pharmacy to pick up prescriptions

## 2015-06-01 ENCOUNTER — Encounter (HOSPITAL_BASED_OUTPATIENT_CLINIC_OR_DEPARTMENT_OTHER): Payer: Self-pay | Admitting: *Deleted

## 2015-06-01 ENCOUNTER — Emergency Department (HOSPITAL_BASED_OUTPATIENT_CLINIC_OR_DEPARTMENT_OTHER)
Admission: EM | Admit: 2015-06-01 | Discharge: 2015-06-01 | Disposition: A | Discharge status: Home / Self Care | Payer: 59 | Attending: Emergency Medicine | Admitting: Emergency Medicine

## 2015-06-01 DIAGNOSIS — Z79899 Other long term (current) drug therapy: Secondary | ICD-10-CM | POA: Insufficient documentation

## 2015-06-01 DIAGNOSIS — Z8669 Personal history of other diseases of the nervous system and sense organs: Secondary | ICD-10-CM | POA: Insufficient documentation

## 2015-06-01 DIAGNOSIS — I10 Essential (primary) hypertension: Secondary | ICD-10-CM | POA: Insufficient documentation

## 2015-06-01 DIAGNOSIS — Z792 Long term (current) use of antibiotics: Secondary | ICD-10-CM | POA: Insufficient documentation

## 2015-06-01 NOTE — ED Notes (Signed)
DC instruction reviewed with pt with interpreter available. Pt teaching done re: (1) taking own BP at home each day at same time and document, so when pt returns to MD in 2 weeks for evaluation of BP meds recently began, (2) importance of keeping MD follow up appointment (3) discussed when would need to return to the ED immediately, such as dizziness, vision problems, HA uncontrolled with OTC meds, limb numbness, N/V. Pt fully understood. (4) asked pt if he had access to BP machine to measure his own BP and was there any other needs that needed to be addressed prior to his departure. Opportunity for questions provided with interpreter.

## 2015-06-01 NOTE — Discharge Instructions (Signed)
Hipertensin (Hypertension)  Call your doctor tomorrow to arrange to get your blood pressure rechecked in a week. Return if you feel worse for any reason. El trmino hipertensin es otra forma de denominar a la presin arterial elevada. La presin arterial elevada fuerza al corazn a trabajar ms para bombear la sangre. Una lectura de la presin arterial consta de dos nmeros: uno ms alto sobre uno ms bajo (por ejemplo, 110/72). CUIDADOS EN EL HOGAR   Haga que el mdico le tome nuevamente la presin arterial.  Tome los medicamentos solamente como se lo haya indicado el mdico. Siga cuidadosamente las indicaciones. Los medicamentos pierden eficacia si omite dosis. El hecho de omitir las dosis tambin [place] el riesgo de otros problemas.  No fume.  Contrlese la presin arterial en su casa como se lo haya indicado el mdico. SOLICITE AYUDA SI:  Piensa que tiene una reaccin a los medicamentos que est tomando.  Tiene mareos o dolores de cabeza reiterados.  Se le inflaman (hinchan) los tobillos.  Tiene problemas de visin. SOLICITE AYUDA DE INMEDIATO SI:   Tiene un dolor de cabeza muy intenso y est confundido.  Se siente dbil, aturdido o se desmaya.  Tiene dolor en el pecho o el estmago (abdominal).  Tiene vmitos.  No puede respirar [employer]. ASEGRESE DE QUE:   Comprende estas instrucciones.  Controlar su afeccin.  Recibir ayuda de inmediato si no mejora o si empeora.   Esta informacin no tiene Theme park manager el consejo del mdico. Asegrese de hacerle al mdico cualquier pregunta que tenga.   Document Released: 10/07/2009 Document Revised: 04/24/2013 Elsevier Interactive Patient Education [employer]

## 2015-06-01 NOTE — ED Notes (Signed)
Utilized interpreter with EDP/RN to confirm hx along with symptoms

## 2015-06-01 NOTE — ED Provider Notes (Signed)
CSN: [id number]     Arrival date & time 06/01/15  4098 History   First MD Initiated Contact with Patient 06/01/15 [phone]    Issues obtained through professional interpreter using language line. Patient speaks little English No chief complaint on file.  chief complaint elevated blood pressure (Consider location/radiation/quality/duration/timing/severity/associated sxs/prior Treatment) HPI Complains of gradual onset headache for him today. Headache is diffuse and mild. Patient took his blood pressure at that time noted be 210/110. He treated himself with his antihypertensive medications to include lisinopril-HCTZ  20-25and Norvasc 5 mg. He started Norvasc yesterday. Presently headache is minimal. No other associated symptoms no chest pain or shortness of breath no focal numbness or weakness no visual changes. Headache improving spontaneously. Nothing makes symptoms better or worse Past Medical History  Diagnosis Date  . Elevated BP   . Hypertension   . Sinus congestion   . Sleep apnea     does not use a cpap   denies history of sleep apnea Past Surgical History  Procedure Laterality Date  . Hernia repair  1980s    inguinal   . Colonoscopy    . Nasal septoplasty w/ turbinoplasty Bilateral 07/11/2014    Procedure: NASAL SEPTOPLASTY WITH BILATERAL INFERIOR TURBINATE REDUCTION;  Surgeon: Osborn Coho, MD;  Location: [facility];  Service: ENT;  Laterality: Bilateral;  . Sinus endo w/fusion Bilateral 07/11/2014    Procedure: BILATERAL ENDOSCOPIC SINUS SURGERY/RESECTION OF INTRA NASAL POLYPS WITH FUSION SCAN;  Surgeon: Osborn Coho, MD;  Location:  SURGERY CENTER;  Service: ENT;  Laterality: Bilateral;   Family History  Problem Relation Age of Onset  . Hypertension      M, B   Social History  Substance Use Topics  . Smoking status: Never Smoker   . Smokeless tobacco: Never Used  . Alcohol Use: Yes     Comment: rarely   no illicit drug use  Review of Systems   Constitutional: Negative.   Respiratory: Negative.   Cardiovascular: Negative.   Gastrointestinal: Negative.   Musculoskeletal: Negative.   Skin: Negative.   Neurological: Positive for headaches.  Psychiatric/Behavioral: Negative.   All other systems reviewed and are negative.     Allergies  Review of patient's allergies indicates no known allergies.  Home Medications   Prior to Admission medications   Medication Sig Start Date End Date Taking? Authorizing Provider  amLODipine (NORVASC) 5 MG tablet Take 5 mg by mouth daily.    Historical Provider, MD  amoxicillin-clavulanate (AUGMENTIN) 500-125 MG per tablet Take 1 tablet (500 mg total) by mouth 2 (two) times daily. 07/11/14   Osborn Coho, MD  cyclobenzaprine (FLEXERIL) 10 MG tablet Take 1 tablet (10 mg total) by mouth 2 (two) times daily as needed for muscle spasms. 01/03/15   Mirian Mo, MD  HYDROcodone-acetaminophen (NORCO) 5-325 MG per tablet Take 1-2 tablets by mouth every 6 (six) hours as needed. 07/11/14   Osborn Coho, MD  ibuprofen (ADVIL,MOTRIN) 600 MG tablet Take 1 tablet (600 mg total) by mouth every 6 (six) hours as needed. 01/03/15   Mirian Mo, MD  lisinopril-hydrochlorothiazide (PRINZIDE,ZESTORETIC) 20-25 MG per tablet Take 1 tablet by mouth daily.    Historical Provider, MD   BP 182/98 mmHg  Pulse 69  Temp(Src) 97.6 F (36.4 C) (Oral)  Resp 20  Ht  (1.676 m)  Wt 170 lb (77.111 kg)  BMI 27.45 kg/m2  SpO2 100% Physical Exam  Constitutional: He is oriented to person, place, and time. He appears well-developed and  well-nourished. No distress.  HENT:  Head: Normocephalic and atraumatic.  Eyes: Conjunctivae are normal. Pupils are equal, round, and reactive to light.  Fundi benign  Neck: Neck supple. No tracheal deviation present. No thyromegaly present.  Cardiovascular: Normal rate and regular rhythm.   No murmur heard. Pulmonary/Chest: Effort normal and breath sounds normal.  Abdominal: Soft.  Bowel sounds are normal. He exhibits no distension. There is no tenderness.  Musculoskeletal: Normal range of motion. He exhibits no edema or tenderness.  Neurological: He is alert and oriented to person, place, and time. No cranial nerve deficit. Coordination normal.  Gait normal  Skin: Skin is warm and dry. No rash noted.  Psychiatric: He has a normal mood and affect.  Nursing note and vitals reviewed.   ED Course  Procedures (including critical care time) Labs Review Labs Reviewed - No data to display  Imaging Review No results found. I have personally reviewed and evaluated these images and lab results as part of my medical decision-making.   EKG Interpretation None     Patient declines pain medicine MDM  No signs of hypertensive emergency. Plan. Continue present medications. blood pressure recheck at PMD office 1 week Diagnosis hypertension Final diagnoses:  None        Doug Sou, MD 06/01/15 [phone]

## 2015-06-01 NOTE — ED Notes (Signed)
EDP at bedside  

## 2015-06-01 NOTE — ED Notes (Signed)
Presents with HA, onset at 0400hrs

## 2015-06-01 NOTE — ED Notes (Signed)
Placed on cont pox with int NBP . States HA is mild now

## 2015-06-01 NOTE — ED Notes (Signed)
BEFAST assessment negative °

## 2015-06-01 NOTE — ED Notes (Signed)
First blood pressure was done by the machine and it was 206/103, so I did a manual and it was 182/98

## 2016-05-03 HISTORY — PX: COLONOSCOPY: SHX174

## 2016-12-16 ENCOUNTER — Ambulatory Visit: Payer: 59 | Admitting: Medical

## 2016-12-16 DIAGNOSIS — Z0289 Encounter for other administrative examinations: Secondary | ICD-10-CM

## 2019-07-26 DIAGNOSIS — Q600 Renal agenesis, unilateral: Secondary | ICD-10-CM | POA: Insufficient documentation

## 2019-07-26 DIAGNOSIS — L80 Vitiligo: Secondary | ICD-10-CM | POA: Insufficient documentation

## 2019-07-26 DIAGNOSIS — Z789 Other specified health status: Secondary | ICD-10-CM | POA: Insufficient documentation

## 2020-05-28 ENCOUNTER — Observation Stay (HOSPITAL_COMMUNITY)
Admission: EM | Admit: 2020-05-28 | Discharge: 2020-05-30 | Disposition: A | Discharge status: Home / Self Care | Payer: 59 | Attending: Internal Medicine | Admitting: Internal Medicine

## 2020-05-28 ENCOUNTER — Emergency Department (HOSPITAL_COMMUNITY): Payer: 59

## 2020-05-28 ENCOUNTER — Encounter (HOSPITAL_COMMUNITY): Payer: Self-pay | Admitting: Emergency Medicine

## 2020-05-28 DIAGNOSIS — Z79899 Other long term (current) drug therapy: Secondary | ICD-10-CM | POA: Diagnosis not present

## 2020-05-28 DIAGNOSIS — I639 Cerebral infarction, unspecified: Principal | ICD-10-CM | POA: Diagnosis present

## 2020-05-28 DIAGNOSIS — I10 Essential (primary) hypertension: Secondary | ICD-10-CM | POA: Insufficient documentation

## 2020-05-28 DIAGNOSIS — U071 COVID-19: Secondary | ICD-10-CM | POA: Diagnosis not present

## 2020-05-28 DIAGNOSIS — R112 Nausea with vomiting, unspecified: Secondary | ICD-10-CM | POA: Diagnosis present

## 2020-05-28 DIAGNOSIS — R42 Dizziness and giddiness: Secondary | ICD-10-CM

## 2020-05-28 DIAGNOSIS — H814 Vertigo of central origin: Secondary | ICD-10-CM | POA: Diagnosis not present

## 2020-05-28 DIAGNOSIS — G4730 Sleep apnea, unspecified: Secondary | ICD-10-CM | POA: Diagnosis present

## 2020-05-28 LAB — COMPREHENSIVE METABOLIC PANEL
ALT: 27 U/L (ref 0–44)
AST: 32 U/L (ref 15–41)
Albumin: 4.4 g/dL (ref 3.5–5.0)
Alkaline Phosphatase: 56 U/L (ref 38–126)
Anion gap: 11 (ref 5–15)
BUN: 22 mg/dL (ref 8–23)
CO2: 26 mmol/L (ref 22–32)
Calcium: 8.9 mg/dL (ref 8.9–10.3)
Chloride: 100 mmol/L (ref 98–111)
Creatinine, Ser: 1.05 mg/dL (ref 0.61–1.24)
GFR, Estimated: >60 mL/min (ref >60)
Glucose, Bld: 125 mg/dL — ABNORMAL HIGH (ref 70–99)
Potassium: 4.3 mmol/L (ref 3.5–5.1)
Sodium: 137 mmol/L (ref 135–145)
Total Bilirubin: 1 mg/dL (ref 0.3–1.2)
Total Protein: 7.7 g/dL (ref 6.5–8.1)

## 2020-05-28 LAB — CBC
HCT: 45 % (ref 39.0–52.0)
Hemoglobin: 14.9 g/dL (ref 13.0–17.0)
MCH: 28.9 pg (ref 26.0–34.0)
MCHC: 33.1 g/dL (ref 30.0–36.0)
MCV: 87.4 fL (ref 80.0–100.0)
Platelets: 305 10*3/uL (ref 150–400)
RBC: 5.15 MIL/uL (ref 4.22–5.81)
RDW: 13.2 % (ref 11.5–15.5)
WBC: 11.7 10*3/uL — ABNORMAL HIGH (ref 4.0–10.5)
nRBC: 0 % (ref 0.0–0.2)

## 2020-05-28 LAB — LIPASE, BLOOD: Lipase: 35 U/L (ref 11–51)

## 2020-05-28 IMAGING — CT CT ANGIO HEAD
3 of 10 series · 15 of 47 positions shown · IV contrast (omnipaque)
Comparison: Prior study from [DATE].

CLINICAL DATA: Initial evaluation for acute vertigo, nausea,
vomiting.

EXAM:
CT ANGIOGRAPHY HEAD
TECHNIQUE: Multidetector CT imaging of the head was performed using the
standard protocol during bolus administration of intravenous
contrast. Multiplanar CT image reconstructions and MIPs were
obtained to evaluate the vascular anatomy.
CONTRAST:  75mL OMNIPAQUE IOHEXOL 350 MG/ML SOLN

[Series 8: ax thin · axial · 0.32mm/px · z∈[-120,+12]mm · 9 of 156 slices shown]
[im 12/156  brain]
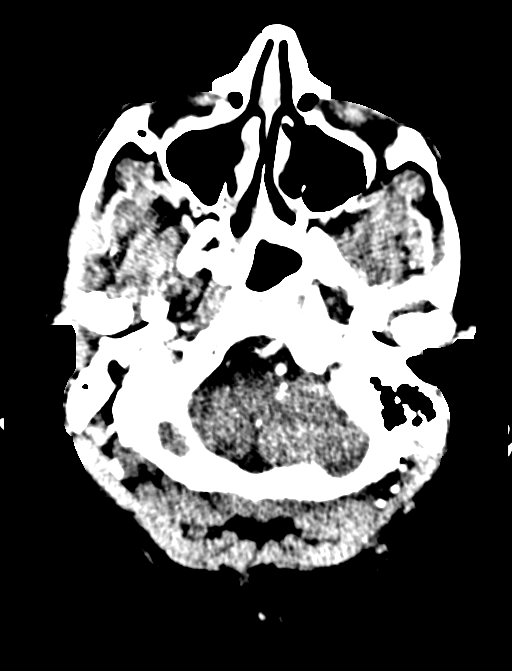
[im 34/156  bone]
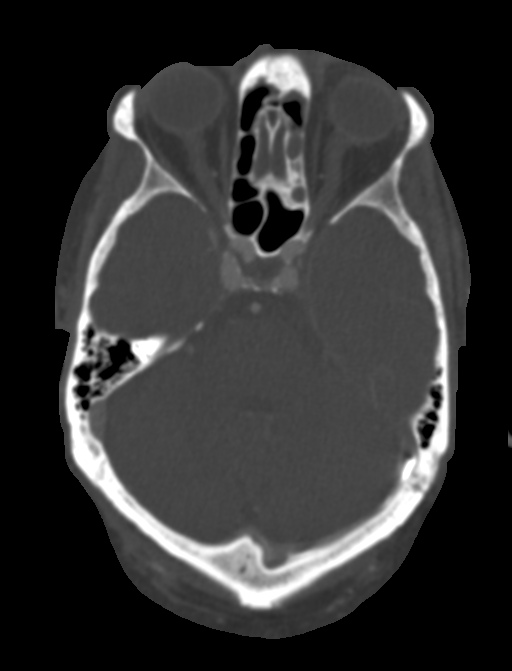
[im 45/156  brain]
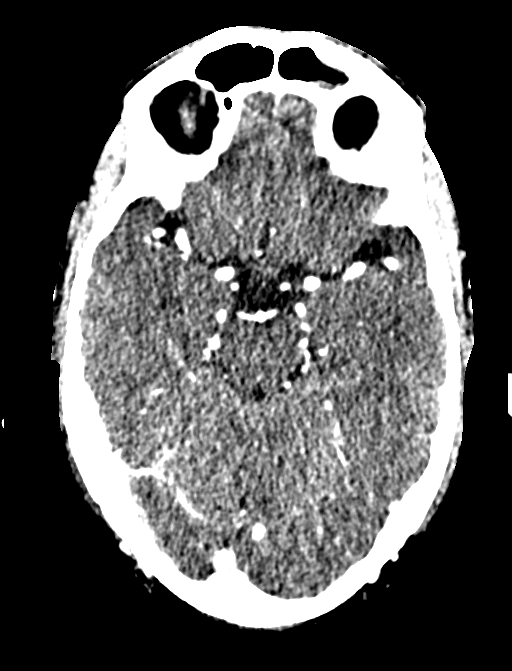
[im 67/156  bone]
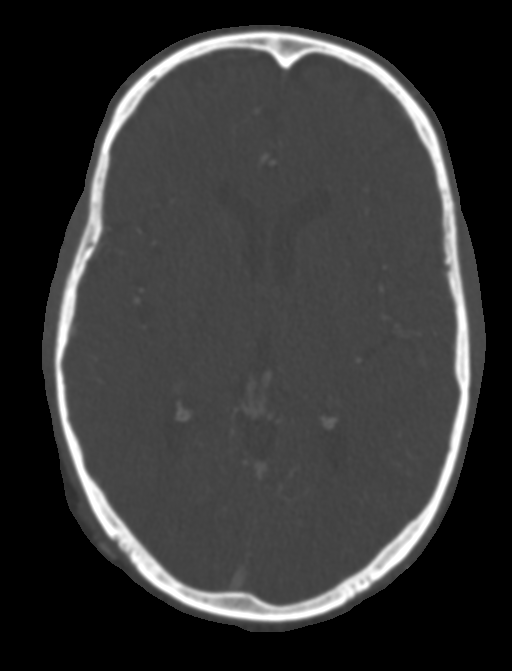
[im 78/156  brain]
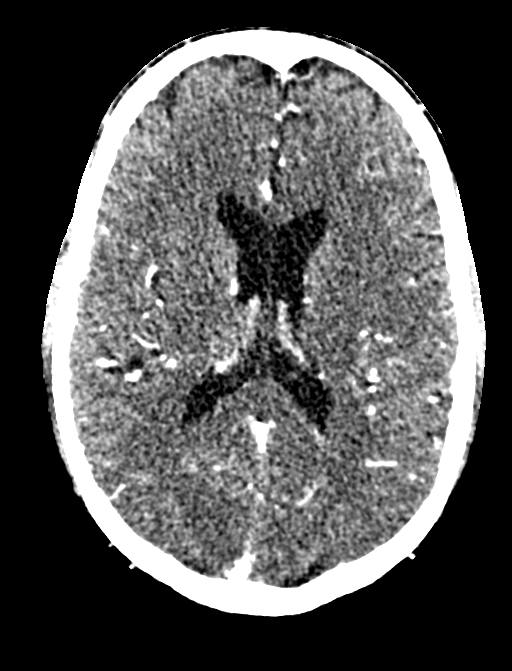
[im 89/156  bone]
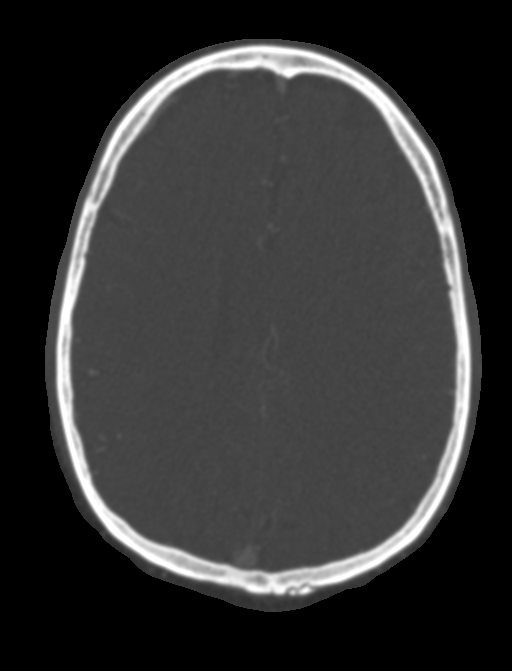
[im 111/156  brain]
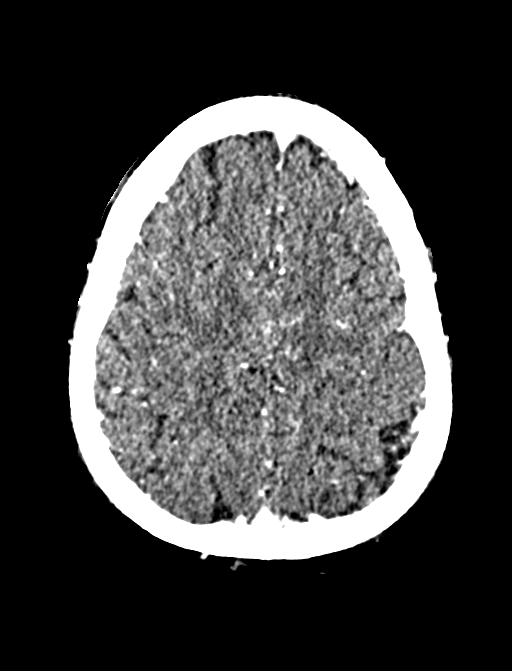
[im 122/156  bone]
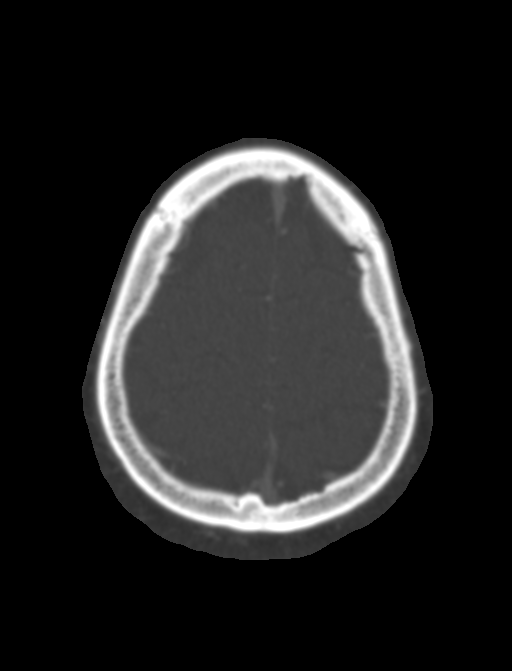
[im 144/156  brain]
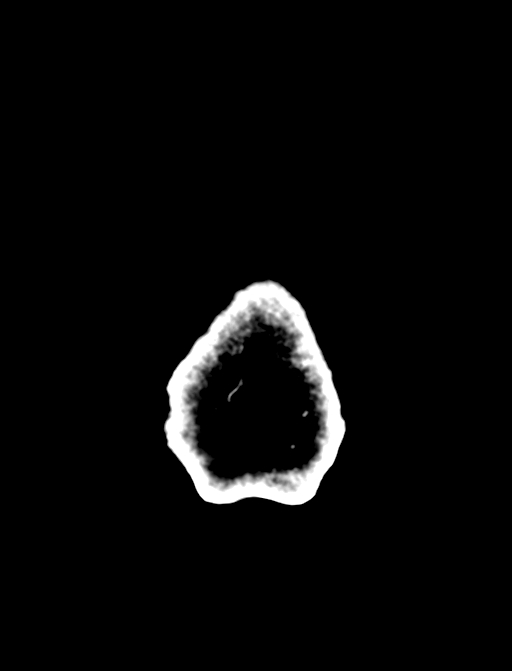

[Series 9: coronal thin · coronal · 0.30mm/px · 3 of 216 slices shown]
[im 72/216  brain]
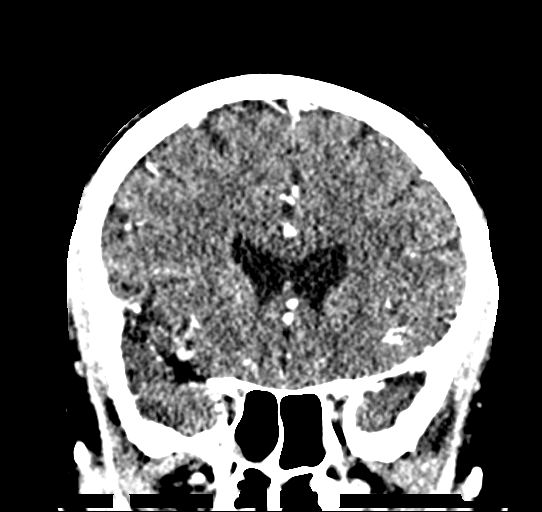
[im 108/216  brain]
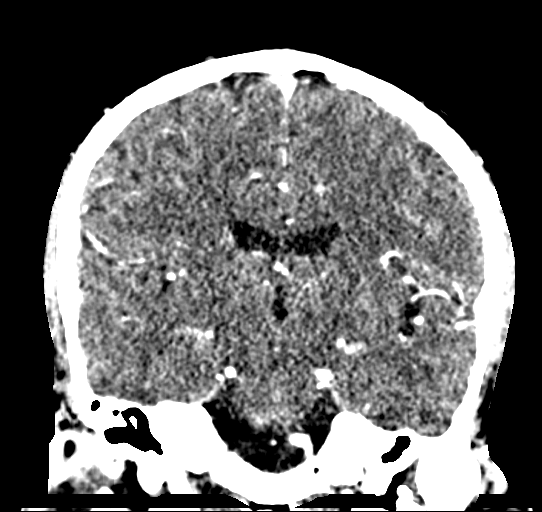
[im 144/216  brain]
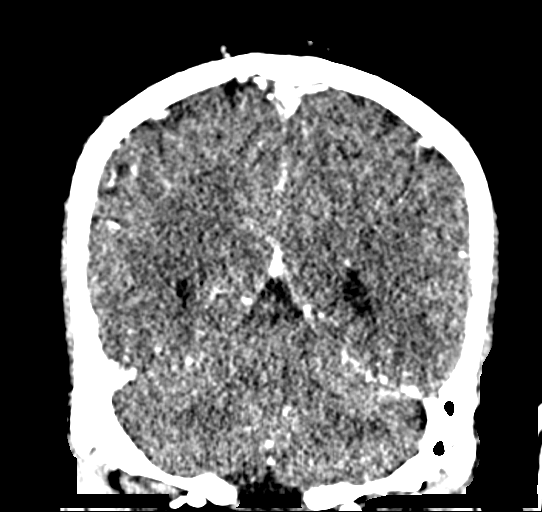

[Series 12: sagittal thin · sagittal · 0.30mm/px · 3 of 163 slices shown]
[im 41/163  brain]
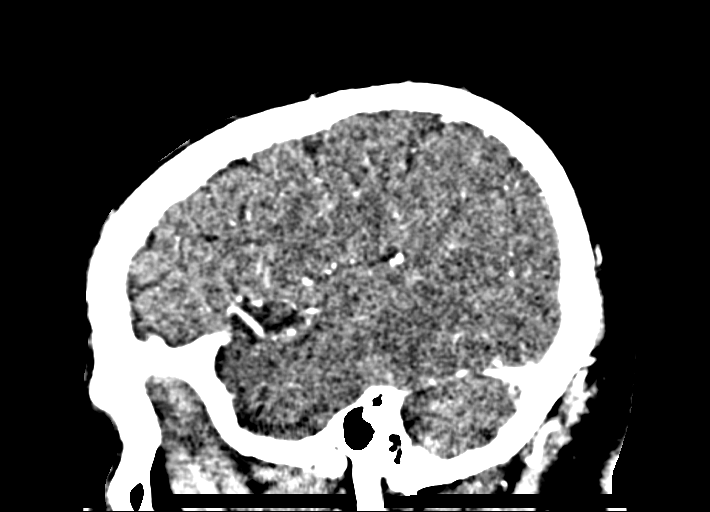
[im 82/163  brain]
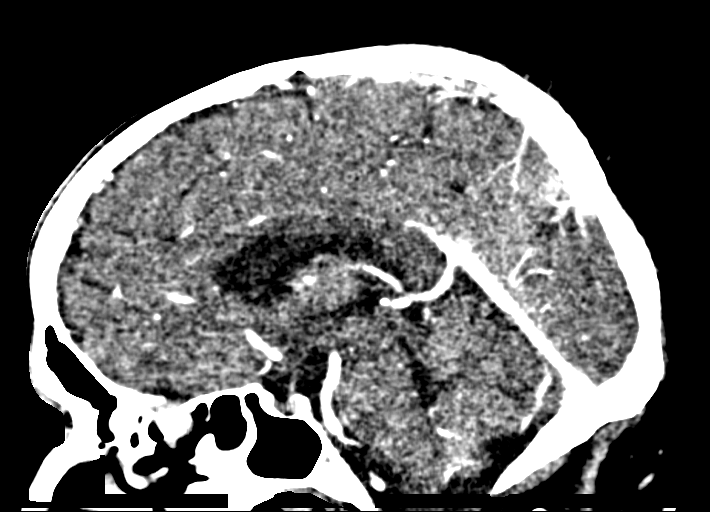
[im 122/163  brain]
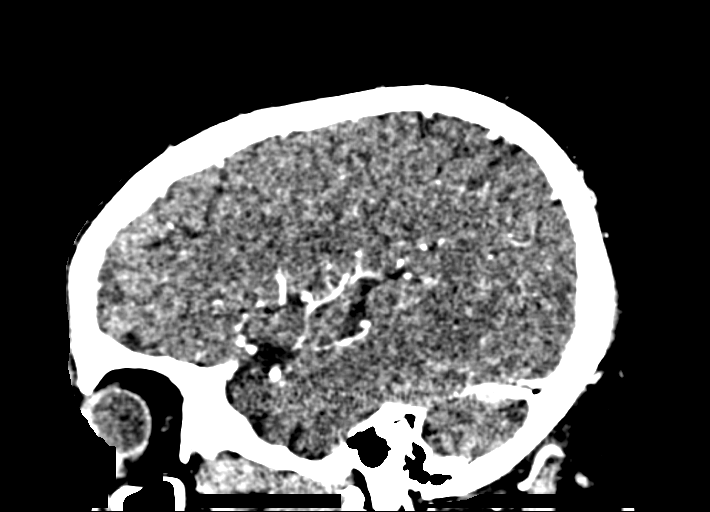

[15 of 47 positions shown; findings below may reference images not displayed]

FINDINGS: CT HEAD

Brain: Cerebral volume within normal limits. No acute intracranial
hemorrhage. No acute large vessel territory infarct. No mass lesion,
midline shift or mass effect. No hydrocephalus or extra-axial fluid
collection.

Vascular: No hyperdense vessel. Scattered vascular calcifications
noted within the carotid siphons.

Skull: Scalp soft tissues and calvarium within normal limits.

Sinuses: Moderate mucosal thickening noted within the visualized
sphenoethmoidal sinuses as well as the left frontal sinus, likely
allergic/inflammatory nature. Mastoid air cells are clear.

Orbits: Globes and orbital soft tissues demonstrate no acute
finding.

CTA HEAD

Anterior circulation: Visualized distal cervical segments of the
internal carotid arteries are widely patent bilaterally. Petrous
segments widely patent. Minimal for age atheromatous plaque within
the carotid siphons without stenosis. A1 segments patent
bilaterally. Normal anterior communicating artery complex. Anterior
cerebral arteries patent to their distal aspects without stenosis.
No M1 stenosis or occlusion. Normal MCA bifurcations. Distal MCA
branches well perfused and symmetric.

Posterior circulation: Both V4 segments patent to the
vertebrobasilar junction without stenosis. Right PICA origin patent
and normal. Left PICA origin not visualized. Basilar patent to its
distal aspect without stenosis. Superior cerebellar arteries patent
bilaterally. Both PCA supplied via the basilar as well as prominent
bilateral posterior communicating arteries. Both PCAs well perfused
to their distal aspects.

Venous sinuses: Patent.

Anatomic variants: None significant.  No intracranial aneurysm.
IMPRESSION: CT HEAD IMPRESSION:

1. Normal head CT.  No acute intracranial abnormality identified.
2. Moderate paranasal sinus disease, likely allergic/inflammatory in
nature.

CTA HEAD IMPRESSION:

Normal CTA of the head. No large vessel occlusion, hemodynamically
significant stenosis, or other acute vascular abnormality. No
intracranial aneurysm.

## 2020-05-28 MED ORDER — ONDANSETRON HCL 4 MG/2ML IJ SOLN
4.0000 mg | Freq: Once | INTRAMUSCULAR | Status: AC
  Administered 2020-05-28: 4 mg via INTRAVENOUS
  Filled 2020-05-28: qty 2

## 2020-05-28 MED ORDER — IOHEXOL 350 MG/ML SOLN
75.0000 mL | Freq: Once | INTRAVENOUS | Status: AC | PRN
  Administered 2020-05-28: 75 mL via INTRAVENOUS

## 2020-05-28 MED ORDER — MECLIZINE HCL 25 MG PO TABS: 25.0000 mg | ORAL_TABLET | Freq: Three times a day (TID) | ORAL | 0 refills | Status: DC | PRN

## 2020-05-28 MED ORDER — MECLIZINE HCL 25 MG PO TABS
25.0000 mg | ORAL_TABLET | Freq: Once | ORAL | Status: AC
  Administered 2020-05-28: 25 mg via ORAL
  Filled 2020-05-28: qty 1

## 2020-05-28 NOTE — Discharge Instructions (Signed)
You were seen in the emergency department for an episode of dizziness.  You had blood work and a CAT scan of your head that did not show any significant abnormalities.  This is likely vertigo.  We are prescribing some medication which may help lessen the symptoms.  Please do not drive or operate heavy machinery until your symptoms have resolved.  Follow-up with your doctor.  Return to the emergency department for any worsening or concerning symptoms

## 2020-05-28 NOTE — ED Notes (Signed)
Patient ambulated to the restroom with difficulty, needing assisting from 2 staff members, Dr. Rayfield Citizen made aware and patient returned to room

## 2020-05-28 NOTE — ED Triage Notes (Signed)
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 20g LFA. 4mg  zofran given en route. Alert and oriented. Speaks spanish.

## 2020-05-28 NOTE — ED Provider Notes (Signed)
[facility]-EMERGENCY DEPT Provider Note   CSN: [id number] Arrival date & time: 05/28/20  1532     History Chief Complaint  Patient presents with  . Nausea  . Emesis    Kayceon Oki is a 60 y.o. male.  He has a history of hypertension.  He was at work when he acutely began experiencing dizziness room spinning nausea and vomiting.  Given Zofran by EMS.  Still feels dizzy.  No history of same.  Denies headache blurry vision double vision numbness or weakness.  No abdominal pain.  No recent head injuries.  Has got 2 Covid vaccinations.  The history is provided by the patient. The history is limited by a language barrier. A language interpreter was used (spanish ipad).  Dizziness Quality:  Room spinning Severity:  Severe Onset quality:  Sudden Timing:  Intermittent Progression:  Unchanged Chronicity:  New Relieved by:  Nothing Worsened by:  Movement Ineffective treatments:  Being still Associated symptoms: nausea and vomiting   Associated symptoms: no chest pain, no headaches, no hearing loss, no shortness of breath, no syncope, no tinnitus and no vision changes        Past Medical History:  Diagnosis Date  . Elevated BP   . Hypertension   . Sinus congestion   . Sleep apnea    does not use a cpap    Patient Active Problem List   Diagnosis Date Noted  . Deviated septum 07/11/2014  . Multiple nasal polyps 07/11/2014  . Elevated BP 08/31/2010  . Back pain 07/31/2010    Past Surgical History:  Procedure Laterality Date  . COLONOSCOPY    . HERNIA REPAIR  1980s   inguinal   . NASAL SEPTOPLASTY W/ TURBINOPLASTY Bilateral 07/11/2014   Procedure: NASAL SEPTOPLASTY WITH BILATERAL INFERIOR TURBINATE REDUCTION;  Surgeon: Osborn Coho, MD;  Location: [facility];  Service: ENT;  Laterality: Bilateral;  . SINUS ENDO W/FUSION Bilateral 07/11/2014   Procedure: BILATERAL ENDOSCOPIC SINUS SURGERY/RESECTION OF INTRA NASAL POLYPS WITH FUSION  SCAN;  Surgeon: Osborn Coho, MD;  Location: [facility];  Service: ENT;  Laterality: Bilateral;       Family History  Problem Relation Age of Onset  . Hypertension Other        M, B    Social History   Tobacco Use  . Smoking status: Never Smoker  . Smokeless tobacco: Never Used  Substance Use Topics  . Alcohol use: Yes    Comment: rarely  . Drug use: No    Home Medications Prior to Admission medications   Medication Sig Start Date End Date Taking? Authorizing Provider  amLODipine (NORVASC) 5 MG tablet Take 5 mg by mouth daily.    [provider]  amoxicillin-clavulanate (AUGMENTIN) 500-125 MG per tablet Take 1 tablet (500 mg total) by mouth 2 (two) times daily. 07/11/14   Osborn Coho, MD  cyclobenzaprine (FLEXERIL) 10 MG tablet Take 1 tablet (10 mg total) by mouth 2 (two) times daily as needed for muscle spasms. 01/03/15   Mirian Mo, MD  HYDROcodone-acetaminophen (NORCO) 5-325 MG per tablet Take 1-2 tablets by mouth every 6 (six) hours as needed. 07/11/14   Osborn Coho, MD  ibuprofen (ADVIL,MOTRIN) 600 MG tablet Take 1 tablet (600 mg total) by mouth every 6 (six) hours as needed. 01/03/15   Mirian Mo, MD  lisinopril-hydrochlorothiazide (PRINZIDE,ZESTORETIC) 20-25 MG per tablet Take 1 tablet by mouth daily.    [provider]    Allergies  Patient has no known allergies.  Review of Systems   Review of Systems  Constitutional: Negative for fever.  HENT: Negative for hearing loss, sore throat and tinnitus.   Eyes: Negative for visual disturbance.  Respiratory: Negative for shortness of breath.   Cardiovascular: Negative for chest pain and syncope.  Gastrointestinal: Positive for nausea and vomiting. Negative for abdominal pain.  Genitourinary: Negative for dysuria.  Musculoskeletal: Negative for neck pain.  Skin: Negative for rash.  Neurological: Positive for dizziness. Negative for headaches.    Physical  Exam Updated Vital Signs BP (!) 161/98 (BP Location: Right Arm)   Pulse 80   Temp 97.8 F (36.6 C) (Oral)   Resp 16   SpO2 100%   Physical Exam Vitals and nursing note reviewed.  Constitutional:      Appearance: Normal appearance. He is well-developed and well-nourished.  HENT:     Head: Normocephalic and atraumatic.     Right Ear: Tympanic membrane and ear canal normal.     Left Ear: Tympanic membrane and ear canal normal.  Eyes:     Conjunctiva/sclera: Conjunctivae normal.     Comments: No nystagmus  Cardiovascular:     Rate and Rhythm: Normal rate and regular rhythm.     Heart sounds: No murmur heard.   Pulmonary:     Effort: Pulmonary effort is normal. No respiratory distress.     Breath sounds: Normal breath sounds.  Abdominal:     Palpations: Abdomen is soft.     Tenderness: There is no abdominal tenderness.  Musculoskeletal:        General: No deformity, signs of injury or edema. Normal range of motion.     Cervical back: Neck supple.  Skin:    General: Skin is warm and dry.     Capillary Refill: Capillary refill takes less than 2 seconds.  Neurological:     General: No focal deficit present.     Mental Status: He is alert and oriented to person, place, and time.     Cranial Nerves: No cranial nerve deficit.     Sensory: No sensory deficit.     Motor: No weakness.  Psychiatric:        Mood and Affect: Mood and affect normal.     ED Results / Procedures / Treatments   Labs (all labs ordered are listed, but only abnormal results are displayed) Labs Reviewed  SARS CORONAVIRUS 2 (TAT 6-24 HRS) - Abnormal; Notable for the following components:      Result Value   SARS Coronavirus 2 POSITIVE (*)    All other components within normal limits  COMPREHENSIVE METABOLIC PANEL - Abnormal; Notable for the following components:   Glucose, Bld 125 (*)    All other components within normal limits  CBC - Abnormal; Notable for the following components:   WBC 11.7 (*)     All other components within normal limits  LIPASE, BLOOD  CBC WITH DIFFERENTIAL/PLATELET  COMPREHENSIVE METABOLIC PANEL  MAGNESIUM  PHOSPHORUS  LACTATE DEHYDROGENASE  FIBRINOGEN  FERRITIN  C-REACTIVE PROTEIN  SEDIMENTATION RATE  D-DIMER, QUANTITATIVE (NOT AT [facility])    EKG EKG Interpretation  Date/Time:  Wednesday May 28 2020 15:47:56 EST Ventricular Rate:  88 PR Interval:    QRS Duration: 89 QT Interval:  378 QTC Calculation: 458 R Axis:   -21 Text Interpretation: Sinus rhythm Borderline left axis deviation No old tracing to compare Confirmed by Meridee Score [phone]) on 05/28/2020 8:55:40 PM   Radiology CT Angio Head W/Cm &/  Or Wo Cm  Result Date: 05/28/2020 CLINICAL DATA:  Initial evaluation for acute vertigo, nausea, vomiting. EXAM: CT ANGIOGRAPHY HEAD TECHNIQUE: Multidetector CT imaging of the head was performed using the standard protocol during bolus administration of intravenous contrast. Multiplanar CT image reconstructions and MIPs were obtained to evaluate the vascular anatomy. CONTRAST:  38mL OMNIPAQUE IOHEXOL 350 MG/ML SOLN COMPARISON:  Prior study from 01/29/2005. FINDINGS: CT HEAD Brain: Cerebral volume within normal limits. No acute intracranial hemorrhage. No acute large vessel territory infarct. No mass lesion, midline shift or mass effect. No hydrocephalus or extra-axial fluid collection. Vascular: No hyperdense vessel. Scattered vascular calcifications noted within the carotid siphons. Skull: Scalp soft tissues and calvarium within normal limits. Sinuses: Moderate mucosal thickening noted within the visualized sphenoethmoidal sinuses as well as the left frontal sinus, likely allergic/inflammatory nature. Mastoid air cells are clear. Orbits: Globes and orbital soft tissues demonstrate no acute finding. CTA HEAD Anterior circulation: Visualized distal cervical segments of the internal carotid arteries are widely patent bilaterally. Petrous segments widely patent.  Minimal for age atheromatous plaque within the carotid siphons without stenosis. A1 segments patent bilaterally. Normal anterior communicating artery complex. Anterior cerebral arteries patent to their distal aspects without stenosis. No M1 stenosis or occlusion. Normal MCA bifurcations. Distal MCA branches well perfused and symmetric. Posterior circulation: Both V4 segments patent to the vertebrobasilar junction without stenosis. Right PICA origin patent and normal. Left PICA origin not visualized. Basilar patent to its distal aspect without stenosis. Superior cerebellar arteries patent bilaterally. Both PCA supplied via the basilar as well as prominent bilateral posterior communicating arteries. Both PCAs well perfused to their distal aspects. Venous sinuses: Patent. Anatomic variants: None significant.  No intracranial aneurysm. IMPRESSION: CT HEAD IMPRESSION: 1. Normal head CT.  No acute intracranial abnormality identified. 2. Moderate paranasal sinus disease, likely allergic/inflammatory in nature. CTA HEAD IMPRESSION: Normal CTA of the head. No large vessel occlusion, hemodynamically significant stenosis, or other acute vascular abnormality. No intracranial aneurysm. Electronically Signed   By: Rise Mu M.D.   On: 05/28/2020 23:37   MR BRAIN WO CONTRAST  Result Date: 05/29/2020 CLINICAL DATA:  Initial evaluation for acute dizziness, ataxia. EXAM: MRI HEAD WITHOUT CONTRAST TECHNIQUE: Multiplanar, multiecho pulse sequences of the brain and surrounding structures were obtained without intravenous contrast. COMPARISON:  Prior CTA from 05/28/2020 FINDINGS: Brain: Cerebral volume within normal limits for age. No significant cerebral white matter disease. There is subtle somewhat ill-defined diffusion signal abnormality involving the central aspect of the midbrain (series 5, image 66). Area of involvement measures up to approximately 8 mm. Question of associated signal loss on ADC map (series 6, image  18). Given the patient's history, finding is suspicious for possible subtle acute small vessel ischemia. No associated hemorrhage or mass effect. No other diffusion abnormality to suggest acute or subacute ischemia. Gray-white matter differentiation otherwise maintained. No encephalomalacia to suggest chronic cortical infarction elsewhere within the brain. No other evidence for acute or chronic intracranial hemorrhage. No mass lesion, midline shift or mass effect. No hydrocephalus or extra-axial fluid collection. Pituitary gland suprasellar region within normal limits. Midline structures intact. Vascular: Major intracranial vascular flow voids are maintained. Skull and upper cervical spine: Craniocervical junction within normal limits. Bone marrow signal intensity normal. No scalp soft tissue abnormality. Sinuses/Orbits: Globes and orbital soft tissues within normal limits. Moderate mucosal thickening noted throughout the paranasal sinuses, chronic in appearance. No mastoid effusion. Inner ear structures grossly normal. Other: None. IMPRESSION: 1. Subtle 8 mm ill-defined diffusion signal abnormality  involving the central aspect of the midbrain, suspicious for possible acute small vessel ischemia given the patient's history. No associated hemorrhage or mass effect. 2. Otherwise normal brain MRI for age. 3. Moderate chronic paranasal sinus disease. Electronically Signed   By: Rise Mu M.D.   On: 05/29/2020 02:37    Procedures Procedures   Medications Ordered in ED Medications  meclizine (ANTIVERT) tablet 25 mg (has no administration in time range)  ondansetron (ZOFRAN) injection 4 mg (has no administration in time range)    ED Course  I have reviewed the triage vital signs and the nursing notes.  Pertinent labs & imaging results that were available during my care of the patient were reviewed by me and considered in my medical decision making (see chart for details).  Clinical Course as of  05/29/20 1048  Wed May 28, 2020  2334 Discussed with radiology.  CT head and angio are negative. [MB]  2337 Reassessed patient and he said his dizziness is improved.  He can move his head around and room and does not have any recurrence of his symptoms.  He is comfortable plan for discharge and outpatient follow-up.  Return instructions discussed. [MB]  2359 Ambulated patient in department he was still quite ataxic.  Definitely not safe for discharge.  MRI unavailable on this campus.  Will need to be transferred over to [facility] for MRI brain and possible neurology evaluation. [MB]  Thu May 29, 2020  0000 Patient accepted to [facility] ED by Dr. Preston Fleeting. [MB]    Clinical Course User Index [MB] Terrilee Files, MD   MDM Rules/Calculators/A&P                         This patient complains of dizziness unsteady gait; this involves an extensive number of treatment Options and is a complaint that carries with it a high risk of complications and Morbidity. The differential includes peripheral vertigo, central vertigo, stroke, bleed  I ordered, reviewed and interpreted labs, which included CBC with mildly elevated white count, normal hemoglobin, chemistries normal, I ordered medication IV Zofran for nausea, oral meclizine with improvement and dizziness I ordered imaging studies which included CTA head and I independently    visualized and interpreted imaging which showed no acute findings Previous records obtained and reviewed in epic no recent admissions After the interventions stated above, I reevaluated the patient and found patient to be much improved and his symptoms.  Unfortunately on ambulation he is still quite unsteady and requiring assistance.  Do not feel he is safe for discharge.  Will need MRI to exclude brainstem or cerebellar stroke.  Will transfer to [facility] for such.   Final Clinical Impression(s) / ED Diagnoses Final diagnoses:  Dizziness  Vertigo, central origin    Rx / DC  Orders ED Discharge Orders    None       Terrilee Files, MD 05/29/20 1052

## 2020-05-29 ENCOUNTER — Observation Stay (HOSPITAL_BASED_OUTPATIENT_CLINIC_OR_DEPARTMENT_OTHER): Payer: 59

## 2020-05-29 ENCOUNTER — Emergency Department (HOSPITAL_COMMUNITY): Payer: 59

## 2020-05-29 ENCOUNTER — Encounter (HOSPITAL_COMMUNITY): Payer: Self-pay | Admitting: Family Medicine

## 2020-05-29 ENCOUNTER — Observation Stay (HOSPITAL_COMMUNITY): Payer: 59

## 2020-05-29 DIAGNOSIS — G4730 Sleep apnea, unspecified: Secondary | ICD-10-CM

## 2020-05-29 DIAGNOSIS — I6389 Other cerebral infarction: Secondary | ICD-10-CM

## 2020-05-29 DIAGNOSIS — I10 Essential (primary) hypertension: Secondary | ICD-10-CM | POA: Diagnosis not present

## 2020-05-29 DIAGNOSIS — U071 COVID-19: Secondary | ICD-10-CM | POA: Diagnosis not present

## 2020-05-29 DIAGNOSIS — Z79899 Other long term (current) drug therapy: Secondary | ICD-10-CM | POA: Diagnosis not present

## 2020-05-29 DIAGNOSIS — H814 Vertigo of central origin: Secondary | ICD-10-CM | POA: Diagnosis not present

## 2020-05-29 DIAGNOSIS — I639 Cerebral infarction, unspecified: Secondary | ICD-10-CM

## 2020-05-29 DIAGNOSIS — R112 Nausea with vomiting, unspecified: Secondary | ICD-10-CM | POA: Diagnosis present

## 2020-05-29 LAB — CBC WITH DIFFERENTIAL/PLATELET
Abs Immature Granulocytes: 0.02 10*3/uL (ref 0.00–0.07)
Basophils Absolute: 0 10*3/uL (ref 0.0–0.1)
Basophils Relative: 0 %
Eosinophils Absolute: 0 10*3/uL (ref 0.0–0.5)
Eosinophils Relative: 0 %
HCT: 43.3 % (ref 39.0–52.0)
Hemoglobin: 14 g/dL (ref 13.0–17.0)
Immature Granulocytes: 0 %
Lymphocytes Relative: 17 %
Lymphs Abs: 1.3 10*3/uL (ref 0.7–4.0)
MCH: 28.3 pg (ref 26.0–34.0)
MCHC: 32.3 g/dL (ref 30.0–36.0)
MCV: 87.7 fL (ref 80.0–100.0)
Monocytes Absolute: 0.9 10*3/uL (ref 0.1–1.0)
Monocytes Relative: 11 %
Neutro Abs: 5.8 10*3/uL (ref 1.7–7.7)
Neutrophils Relative %: 72 %
Platelets: 313 10*3/uL (ref 150–400)
RBC: 4.94 MIL/uL (ref 4.22–5.81)
RDW: 13.4 % (ref 11.5–15.5)
WBC: 8 10*3/uL (ref 4.0–10.5)
nRBC: 0 % (ref 0.0–0.2)

## 2020-05-29 LAB — COMPREHENSIVE METABOLIC PANEL
ALT: 25 U/L (ref 0–44)
AST: 24 U/L (ref 15–41)
Albumin: 3.6 g/dL (ref 3.5–5.0)
Alkaline Phosphatase: 52 U/L (ref 38–126)
Anion gap: 9 (ref 5–15)
BUN: 14 mg/dL (ref 8–23)
CO2: 27 mmol/L (ref 22–32)
Calcium: 9 mg/dL (ref 8.9–10.3)
Chloride: 103 mmol/L (ref 98–111)
Creatinine, Ser: 1.01 mg/dL (ref 0.61–1.24)
GFR, Estimated: >60 mL/min (ref >60)
Glucose, Bld: 98 mg/dL (ref 70–99)
Potassium: 3.7 mmol/L (ref 3.5–5.1)
Sodium: 139 mmol/L (ref 135–145)
Total Bilirubin: 1.5 mg/dL — ABNORMAL HIGH (ref 0.3–1.2)
Total Protein: 7.4 g/dL (ref 6.5–8.1)

## 2020-05-29 LAB — FERRITIN: Ferritin: 99 ng/mL (ref 24–336)

## 2020-05-29 LAB — ECHOCARDIOGRAM COMPLETE
Area-P 1/2: 3.85 cm2
S' Lateral: 3 cm

## 2020-05-29 LAB — PHOSPHORUS: Phosphorus: 3 mg/dL (ref 2.5–4.6)

## 2020-05-29 LAB — LACTATE DEHYDROGENASE: LDH: 150 U/L (ref 98–192)

## 2020-05-29 LAB — MAGNESIUM: Magnesium: 2.3 mg/dL (ref 1.7–2.4)

## 2020-05-29 LAB — C-REACTIVE PROTEIN: CRP: 0.6 mg/dL (ref <1.0)

## 2020-05-29 LAB — FIBRINOGEN: Fibrinogen: 348 mg/dL (ref 210–475)

## 2020-05-29 LAB — SEDIMENTATION RATE: Sed Rate: 11 mm/hr (ref 0–16)

## 2020-05-29 LAB — D-DIMER, QUANTITATIVE: D-Dimer, Quant: 1.19 ug/mL-FEU — ABNORMAL HIGH (ref 0.00–0.50)

## 2020-05-29 LAB — SARS CORONAVIRUS 2 (TAT 6-24 HRS): SARS Coronavirus 2: POSITIVE — AB

## 2020-05-29 IMAGING — DX DG CHEST 1V PORT
1 series · 1 of 1 positions shown · non-contrast
Comparison: None.

CLINICAL DATA: [QI] positive.  Nausea and vomiting

EXAM:
PORTABLE CHEST 1 VIEW

[chest ap]
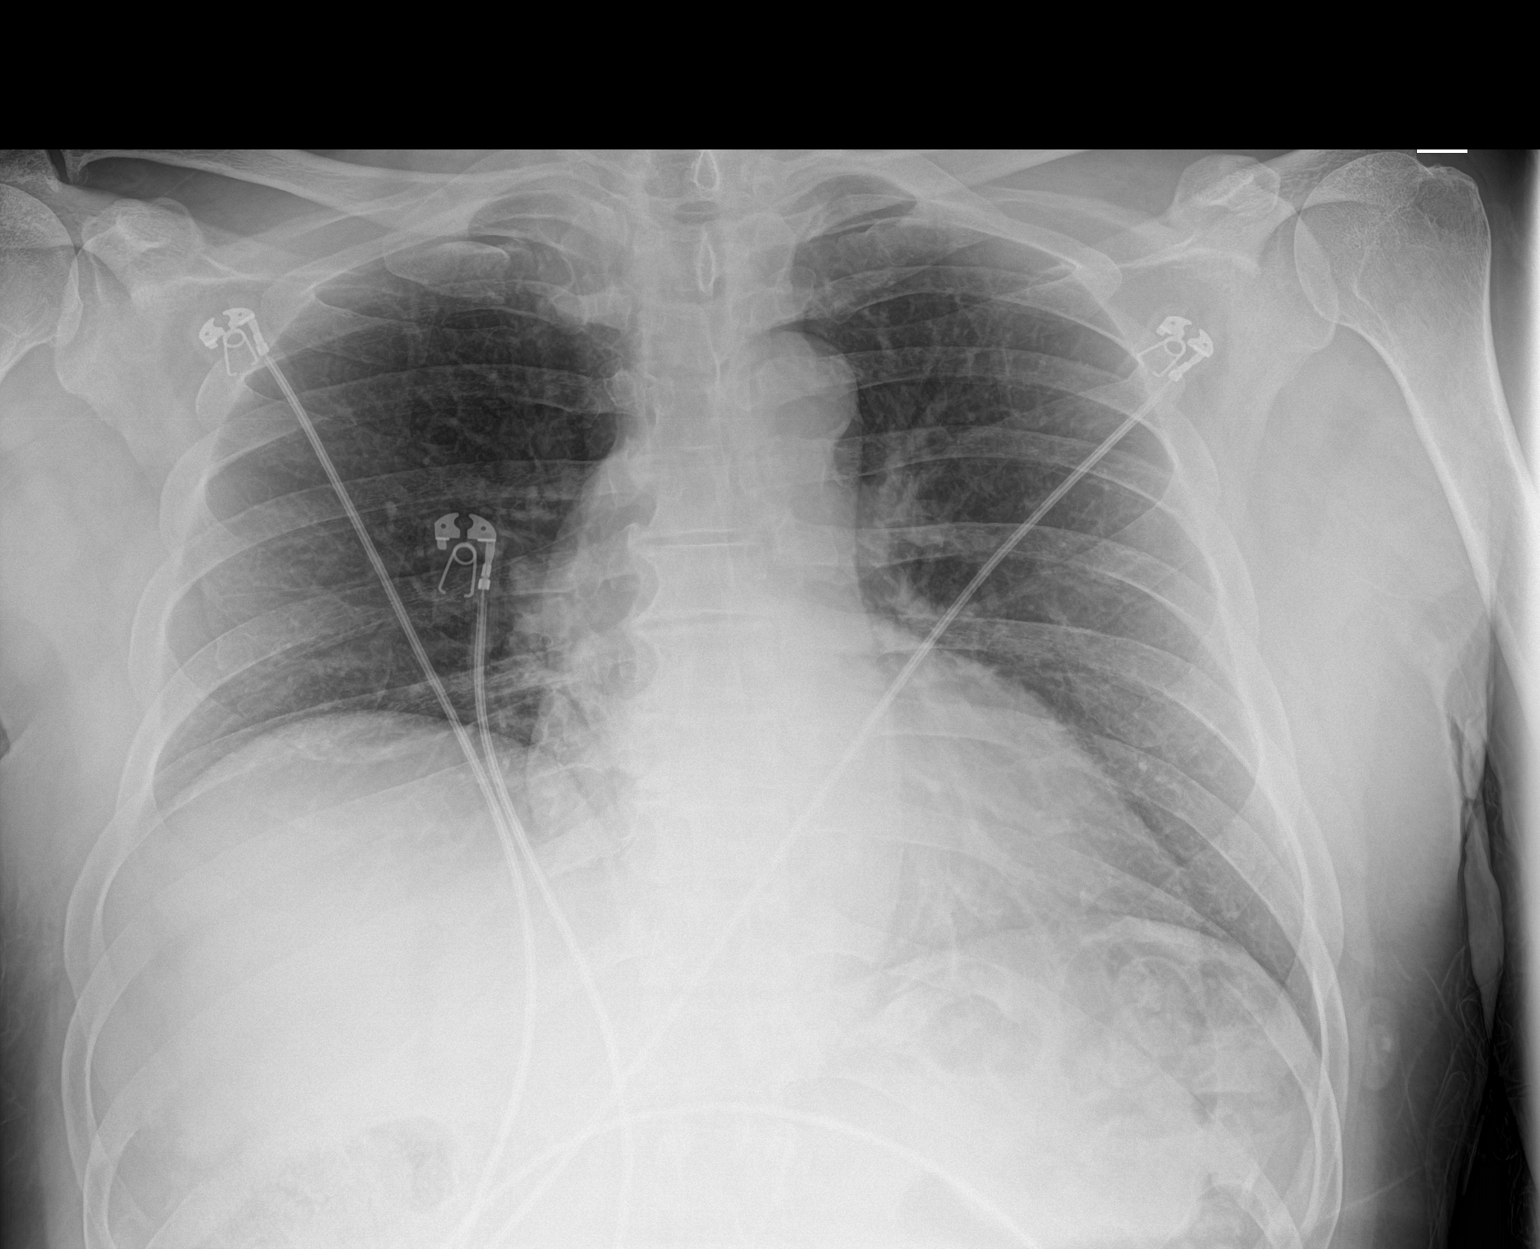

[1 of 1 positions shown; findings below may reference images not displayed]

FINDINGS: Lungs are clear. Heart is upper normal in size with pulmonary
vascularity normal. No adenopathy. There is aortic atherosclerosis.
No bone lesions.
IMPRESSION: No edema or airspace opacity. Heart upper normal in size. Aortic
Atherosclerosis ([QI]-[QI]).

## 2020-05-29 IMAGING — MR MR HEAD W/O CM
12 of 13 series · 44 of 48 positions shown · non-contrast
Comparison: Prior CTA from [DATE]

CLINICAL DATA: Initial evaluation for acute dizziness, ataxia.

EXAM:
MRI HEAD WITHOUT CONTRAST
TECHNIQUE: Multiplanar, multiecho pulse sequences of the brain and surrounding
structures were obtained without intravenous contrast.

[Series 5: DWI · axial · 3.0mm · 0.88mm/px · z∈[-109,+31]mm · 7 of 96 slices shown (1 of 4)]
[im 1/96]
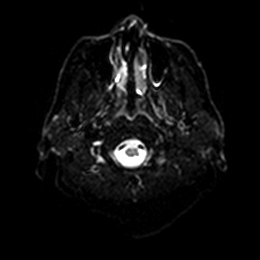
[im 16/96]
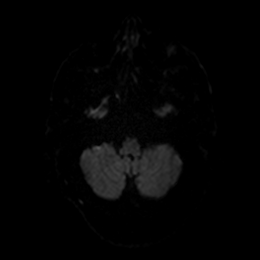
[im 32/96]
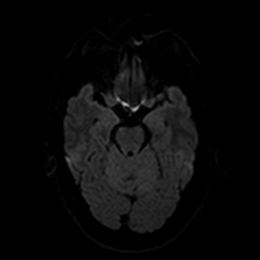
[im 48/96]
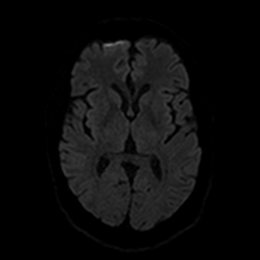
[im 64/96]
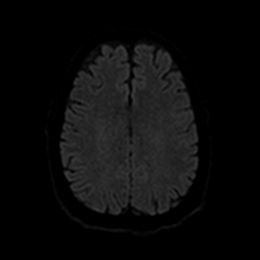
[im 80/96]
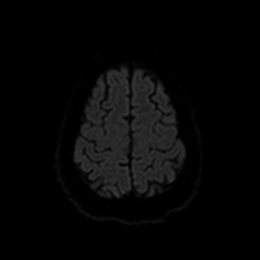
[im 96/96]
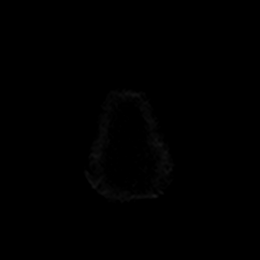

[Series 6: DWI · axial · 3.0mm · 0.88mm/px · z∈[-109,+31]mm · 4 of 48 slices shown (2 of 4)]
[im 1/48]
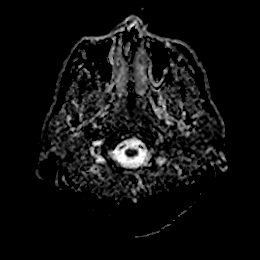
[im 16/48]
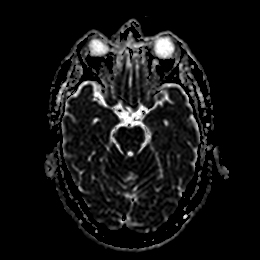
[im 32/48]
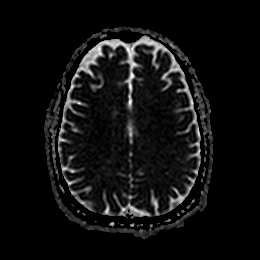
[im 48/48]
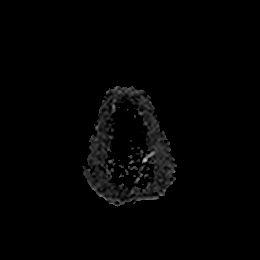

[Series 7: DWI · coronal · 4.0mm · 0.88mm/px · 6 of 74 slices shown (3 of 4)]
[im 1/74]
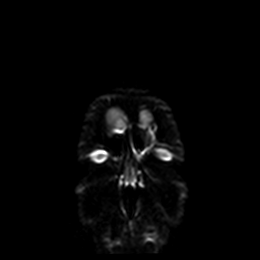
[im 15/74]
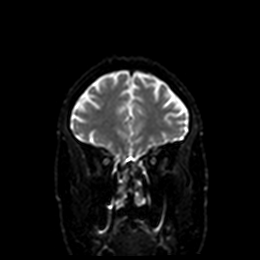
[im 30/74]
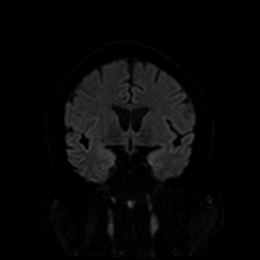
[im 44/74]
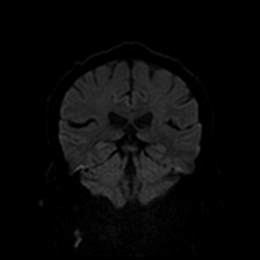
[im 59/74]
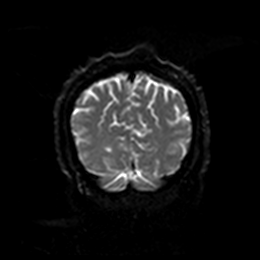
[im 74/74]
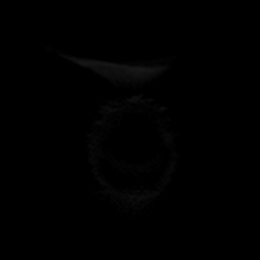

[Series 8: DWI · coronal · 4.0mm · 0.88mm/px · 3 of 37 slices shown (4 of 4)]
[im 1/37]
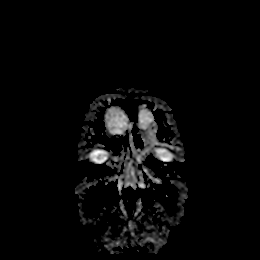
[im 19/37]
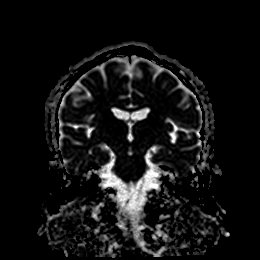
[im 37/37]
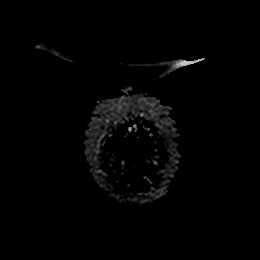

[Series 9: T1 · sagittal · 5.0mm · 0.75mm/px · 2 of 23 slices shown]
[im 1/23]
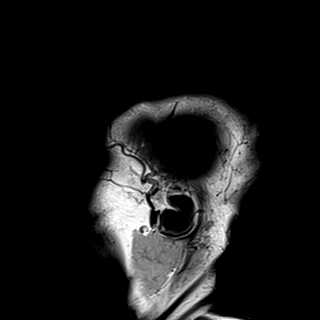
[im 23/23]
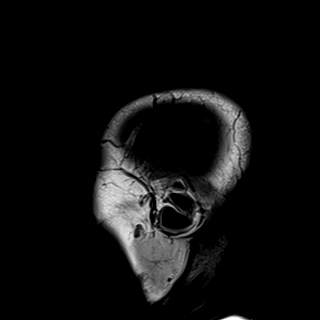

[Series 10: T2 · axial · 5.0mm · 0.72mm/px · z∈[-117,+39]mm · 2 of 27 slices shown (1 of 2)]
[im 1/27]
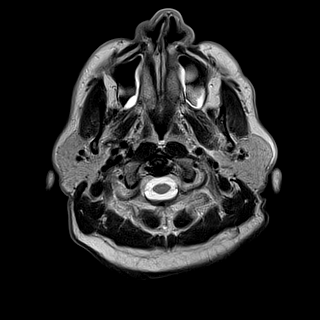
[im 27/27]
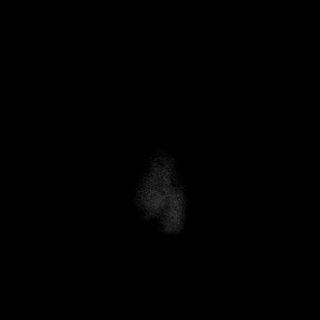

[Series 11: FLAIR · axial · 5.0mm · 0.45mm/px · z∈[-117,+38]mm · 2 of 27 slices shown]
[im 1/27]
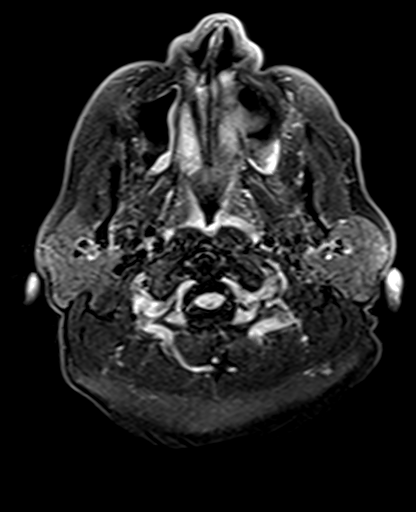
[im 27/27]
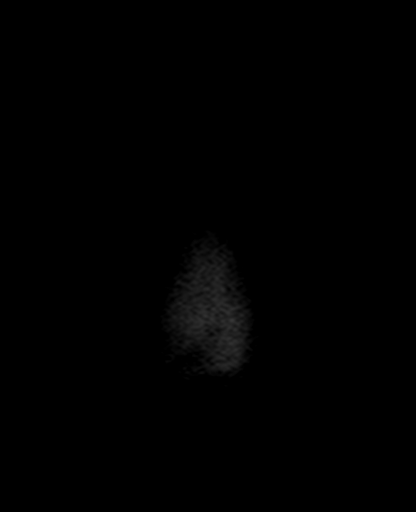

[Series 12: mag_images · axial · 3.0mm · 0.90mm/px · z∈[-116,+36]mm · 4 of 52 slices shown]
[im 1/52]
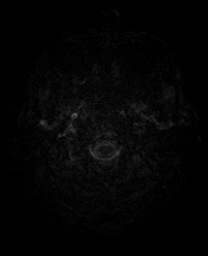
[im 18/52]
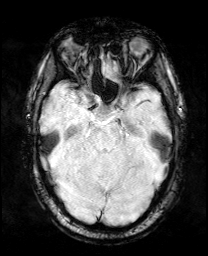
[im 35/52]
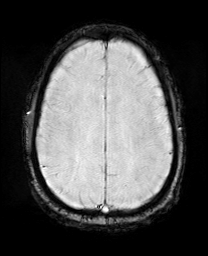
[im 52/52]
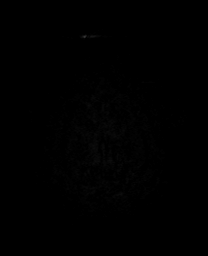

[Series 13: pha_images · axial · 3.0mm · 0.90mm/px · z∈[-116,+33]mm · 4 of 51 slices shown]
[im 1/51]
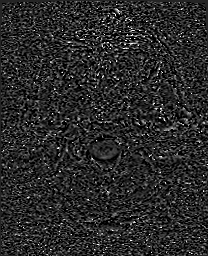
[im 17/51]
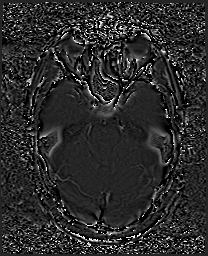
[im 34/51]
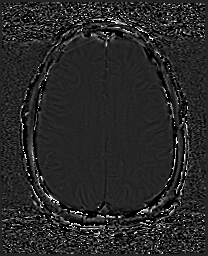
[im 51/51]
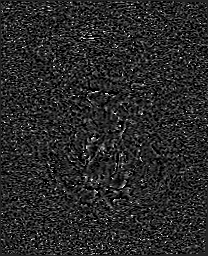

[Series 14: swi_images · axial · 3.0mm · 0.90mm/px · z∈[-116,+36]mm · 4 of 52 slices shown]
[im 1/52]
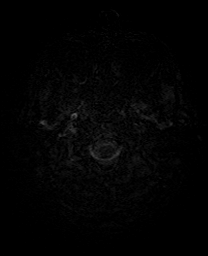
[im 18/52]
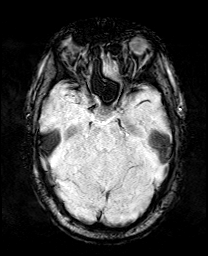
[im 35/52]
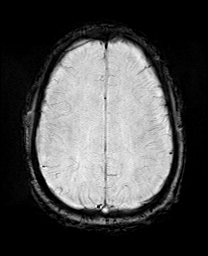
[im 52/52]
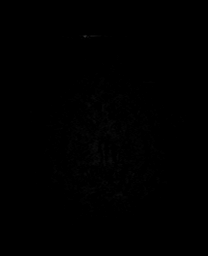

[Series 15: mip_images(sw) · axial · 24.0mm · 0.90mm/px · z∈[-106,+26]mm · 4 of 45 slices shown]
[im 1/45]
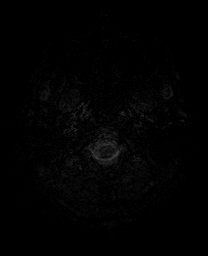
[im 15/45]
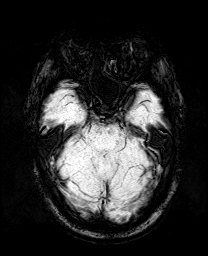
[im 30/45]
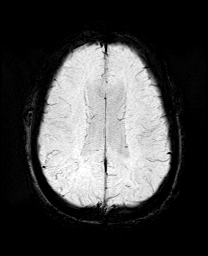
[im 45/45]
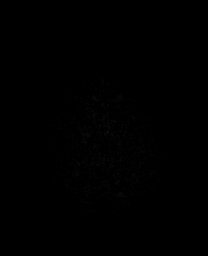

[Series 17: T2 · coronal · 5.0mm · 0.34mm/px · 2 of 31 slices shown (2 of 2)]
[im 1/31]
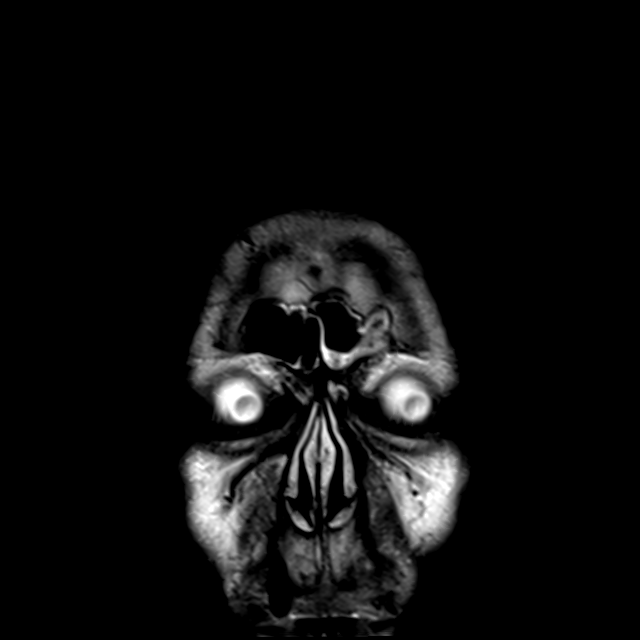
[im 31/31]
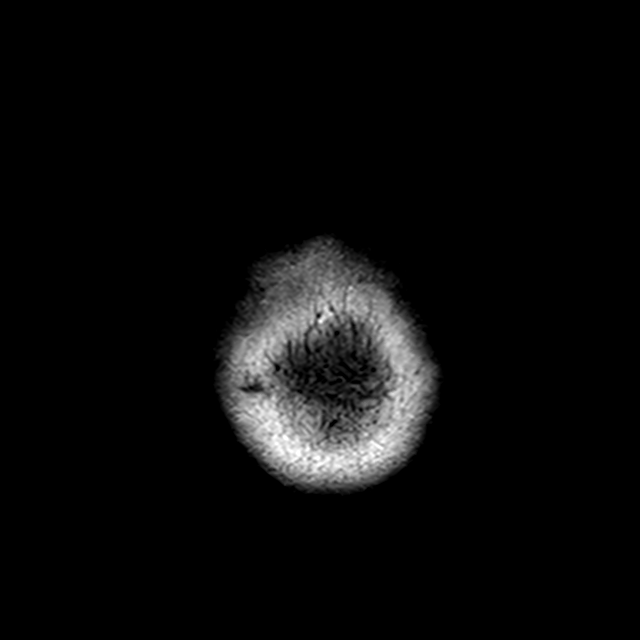

[44 of 48 positions shown; findings below may reference images not displayed]

FINDINGS: Brain: Cerebral volume within normal limits for age. No significant
cerebral white matter disease. There is subtle somewhat ill-defined
diffusion signal abnormality involving the central aspect of the
midbrain (series 5, image 66). Area of involvement measures up to
approximately 8 mm. Question of associated signal loss on ADC map
(series 6, image 18). Given the patient's history, finding is
suspicious for possible subtle acute small vessel ischemia. No
associated hemorrhage or mass effect.

No other diffusion abnormality to suggest acute or subacute
ischemia. Gray-white matter differentiation otherwise maintained. No
encephalomalacia to suggest chronic cortical infarction elsewhere
within the brain. No other evidence for acute or chronic
intracranial hemorrhage.

No mass lesion, midline shift or mass effect. No hydrocephalus or
extra-axial fluid collection. Pituitary gland suprasellar region
within normal limits. Midline structures intact.

Vascular: Major intracranial vascular flow voids are maintained.

Skull and upper cervical spine: Craniocervical junction within
normal limits. Bone marrow signal intensity normal. No scalp soft
tissue abnormality.

Sinuses/Orbits: Globes and orbital soft tissues within normal
limits. Moderate mucosal thickening noted throughout the paranasal
sinuses, chronic in appearance. No mastoid effusion. Inner ear
structures grossly normal.

Other: None.
IMPRESSION: 1. Subtle 8 mm ill-defined diffusion signal abnormality involving
the central aspect of the midbrain, suspicious for possible acute
small vessel ischemia given the patient's history. No associated
hemorrhage or mass effect.
2. Otherwise normal brain MRI for age.
3. Moderate chronic paranasal sinus disease.

## 2020-05-29 MED ORDER — ACETAMINOPHEN 650 MG RE SUPP: 650.0000 mg | RECTAL | Status: DC | PRN

## 2020-05-29 MED ORDER — CLOPIDOGREL BISULFATE 300 MG PO TABS
300.0000 mg | ORAL_TABLET | Freq: Once | ORAL | Status: AC
  Administered 2020-05-29: 300 mg via ORAL
  Filled 2020-05-29: qty 1

## 2020-05-29 MED ORDER — ACETAMINOPHEN 160 MG/5ML PO SOLN: 650.0000 mg | ORAL | Status: DC | PRN

## 2020-05-29 MED ORDER — ONDANSETRON HCL 4 MG/2ML IJ SOLN: 4.0000 mg | Freq: Four times a day (QID) | INTRAMUSCULAR | Status: DC | PRN

## 2020-05-29 MED ORDER — ATORVASTATIN CALCIUM 80 MG PO TABS
80.0000 mg | ORAL_TABLET | Freq: Every day | ORAL | Status: DC
Start: 2020-05-29 — End: 2020-05-30
  Administered 2020-05-29 – 2020-05-30 (×2): 80 mg via ORAL
  Filled 2020-05-29 (×2): qty 1

## 2020-05-29 MED ORDER — ASPIRIN EC 325 MG PO TBEC
325.0000 mg | DELAYED_RELEASE_TABLET | Freq: Once | ORAL | Status: AC
  Administered 2020-05-29: 325 mg via ORAL
  Filled 2020-05-29: qty 1

## 2020-05-29 MED ORDER — SODIUM CHLORIDE 0.9 % IV SOLN: INTRAVENOUS | Status: AC

## 2020-05-29 MED ORDER — ASPIRIN 325 MG PO TABS: 325.0000 mg | ORAL_TABLET | Freq: Every day | ORAL | Status: DC

## 2020-05-29 MED ORDER — MECLIZINE HCL 12.5 MG PO TABS: 25.0000 mg | ORAL_TABLET | Freq: Three times a day (TID) | ORAL | Status: DC | PRN

## 2020-05-29 MED ORDER — ACETAMINOPHEN 325 MG PO TABS: 650.0000 mg | ORAL_TABLET | ORAL | Status: DC | PRN

## 2020-05-29 MED ORDER — ENOXAPARIN SODIUM 40 MG/0.4ML ~~LOC~~ SOLN
40.0000 mg | Freq: Every day | SUBCUTANEOUS | Status: DC
  Administered 2020-05-29 – 2020-05-30 (×2): 40 mg via SUBCUTANEOUS
  Filled 2020-05-29 (×2): qty 0.4

## 2020-05-29 MED ORDER — ASPIRIN EC 81 MG PO TBEC
81.0000 mg | DELAYED_RELEASE_TABLET | Freq: Every day | ORAL | Status: DC
  Administered 2020-05-30: 81 mg via ORAL
  Filled 2020-05-29: qty 1

## 2020-05-29 MED ORDER — ASPIRIN 300 MG RE SUPP: 300.0000 mg | Freq: Every day | RECTAL | Status: DC

## 2020-05-29 MED ORDER — STROKE: EARLY STAGES OF RECOVERY BOOK: Freq: Once | Status: DC

## 2020-05-29 MED ORDER — CLOPIDOGREL BISULFATE 75 MG PO TABS
75.0000 mg | ORAL_TABLET | Freq: Every day | ORAL | Status: DC
  Administered 2020-05-30: 75 mg via ORAL
  Filled 2020-05-29: qty 1

## 2020-05-29 MED ORDER — SENNOSIDES-DOCUSATE SODIUM 8.6-50 MG PO TABS: 1.0000 | ORAL_TABLET | Freq: Every evening | ORAL | Status: DC | PRN

## 2020-05-29 NOTE — ED Notes (Signed)
Neuro MD at bedside

## 2020-05-29 NOTE — Progress Notes (Signed)
Occupational Therapy Evaluation Patient Details Name: Manuel Peters MRN: [id number] DOB: 1961/03/04 Today's Date: 05/29/2020    History of Present Illness 60 y.o. male with medical history significant for hypertension and OSA, now presenting to the emergency department for evaluation of acute onset dizziness, nausea, and vomiting. MRI + 90mm diffusion abnormality involving central area of the midbrain; vaccinated; Covid +.   Clinical Impression   [employer] interpreter Edelin [phone]) used during session. 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 Pt continues to complain of dizziness which does not appear to worsen with mobility. + nystagmus, especially in R gaze. Gait unsteady at times, requiring min A. Pt started on exercises for central vertigo. Wife present at end of session and is very supportive. At this time, recommend pt follow up with neuro outpt OT to maximize functional level of independence and facilitate safe return to work if symptoms still present once out of isolation. Pt/wife verbalized understanding. Discussed with CM. Will follow acutely. .     Follow Up Recommendations  Outpatient OT (neuro outpt OT - pending progress)    Equipment Recommendations  None recommended by OT    Recommendations for Other Services PT consult     Precautions / Restrictions Precautions Precautions: Fall      Mobility Bed Mobility Overal bed mobility: Independent                  Transfers Overall transfer level: Needs assistance   Transfers: Sit to/from Stand Sit to Stand: Supervision              Balance Overall balance assessment: Needs assistance   Sitting balance-Leahy Scale: Good       Standing balance-Leahy Scale: Fair                             ADL either performed or assessed with clinical judgement   ADL Overall ADL's : Needs assistance/impaired                          Toilet Transfer: Min Pension scheme manager Details (indicate cue type and reason): simulated         Functional mobility during ADLs: Min guard General ADL Comments: set up for UB ADL; Minguard for LB ADL     Vision Baseline Vision/History: No visual deficits Vision Assessment?: Yes Eye Alignment: Within Functional Limits Ocular Range of Motion: Within Functional Limits Alignment/Gaze Preference: Within Defined Limits Tracking/Visual Pursuits: Decreased smoothness of horizontal tracking;Decreased smoothness of vertical tracking Saccades: Additional eye shifts occurred during testing Convergence: Within functional limits Visual Fields: No apparent deficits Additional Comments: nystagmus, especially R gaze     Perception Perception Perception Tested?:  (appears [facility])   Praxis Praxis Praxis tested?: Within functional limits    Pertinent Vitals/Pain Pain Assessment: No/denies pain     Hand Dominance Left   Extremity/Trunk Assessment Upper Extremity Assessment Upper Extremity Assessment: Overall WFL for tasks assessed   Lower Extremity Assessment Lower Extremity Assessment: Defer to PT evaluation   Cervical / Trunk Assessment Cervical / Trunk Assessment: Normal   Communication Communication Communication: Prefers language other than English (Spanish)   Cognition Arousal/Alertness: Awake/alert Behavior During Therapy: WFL for tasks assessed/performed Overall Cognitive Status: Within Functional Limits for tasks assessed  General Comments  Limited with mobility due to beign in the hallway and having covid; More difficulty with head turns adn stepping backwards    Exercises Exercises: Other exercises Other Exercises Other Exercises: smooth pursuits x 10 Other Exercises: gaze stabilization x 10   Shoulder Instructions      Home Living Family/patient expects to be discharged to:: Private  residence Living Arrangements: Spouse/significant other;Children Available Help at Discharge: Family;Available 24 hours/day Type of Home: House Home Access: Stairs to enter [employer] of Steps: 3   Home Layout: One level     Bathroom Shower/Tub: Tub/shower unit;Walk-in shower   Bathroom Toilet: Standard Bathroom Accessibility: Yes   Home Equipment: None          Prior Functioning/Environment Level of Independence: Independent        Comments: drives; 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 Problem List: Decreased activity tolerance;Impaired balance (sitting and/or standing);Decreased safety awareness;Decreased knowledge of use of DME or AE      OT Treatment/Interventions: Self-care/ADL training;Therapeutic exercise;Neuromuscular education;DME and/or AE instruction;Therapeutic activities;Visual/perceptual remediation/compensation;Patient/family education;Balance training    OT Goals(Current goals can be found in the care plan section) Acute Rehab OT Goals Patient Stated Goal: to get better OT Goal Formulation: With patient Time For Goal Achievement: 06/12/20 Potential to Achieve Goals: Good  OT Frequency: Min 2X/week   Barriers to D/C:            Co-evaluation              AM-PAC OT "6 Clicks" Daily Activity     Outcome Measure Help from another person eating meals?: None Help from another person taking care of personal grooming?: A Little Help from another person toileting, which includes using toliet, bedpan, or urinal?: A Little Help from another person bathing (including washing, rinsing, drying)?: A Little Help from another person to put on and taking off regular upper body clothing?: A Little Help from another person to put on and taking off regular lower body clothing?: A Little 6 Click Score: 19   End of Session Nurse Communication: Mobility status;Other (comment) (DC needs)  Activity Tolerance: Patient tolerated treatment  well Patient left: in bed;with family/visitor present  OT Visit Diagnosis: Unsteadiness on feet (R26.81);Other abnormalities of gait and mobility (R26.89);Dizziness and giddiness (R42)                Time: 9326-7124 OT Time Calculation (min): 41 min Charges:  OT General Charges $OT Visit: 1 Visit OT Evaluation $OT Eval Moderate Complexity: 1 Mod OT Treatments $Therapeutic Activity: 8-22 mins $Neuromuscular Re-education: 8-22 mins  Luisa Dago, OT/L   Acute OT Clinical Specialist Acute Rehabilitation Services Pager [phone] Office [phone]   [facility] 05/29/2020, 11:22 AM

## 2020-05-29 NOTE — Progress Notes (Signed)
NIHSS on initial evaluation: 0  Electronically signed: Dr. Caryl Pina

## 2020-05-29 NOTE — ED Provider Notes (Signed)
2:52 AM Patient care assumed in transfer from [facility] for further evaluation of acute onset dizziness while he was at work yesterday.  Hx of HTN, OSA.  states he is a nonsmoker.  Presently reports feeling much better and that much of his dizziness has subsided.  Previously was requiring 2 person assist for ambulation with persistent nausea and vomiting.  Transferred for MRI to evaluate for acute stroke.  Negative CTA at [facility] ED.  MRI shows subtle 8 mm signal abnormality in the midbrain suspicious for acute ischemia.  Will consult with neurology.    3:07 AM Case discussed with Dr. Otelia Limes of Neurology who will assess the patient in the ED to assist in disposition.  6:07 AM Patient has been evaluated by neurology, Dr. Otelia Limes, who believes the patient did have a stroke causing onset of his dizziness.  Recommends medical admission.  Consult ordered for unassigned.  6:19 AM Case discussed with Dr. Otelia Limes who will admit.   Results for orders placed or performed during the hospital encounter of 05/28/20  Lipase, blood  Result Value Ref Range   Lipase 35 11 - 51 U/L  Comprehensive metabolic panel  Result Value Ref Range   Sodium 137 135 - 145 mmol/L   Potassium 4.3 3.5 - 5.1 mmol/L   Chloride 100 98 - 111 mmol/L   CO2 26 22 - 32 mmol/L   Glucose, Bld 125 (H) 70 - 99 mg/dL   BUN 22 8 - 23 mg/dL   Creatinine, Ser 2.69 0.61 - 1.24 mg/dL   Calcium 8.9 8.9 - 48.5 mg/dL   Total Protein 7.7 6.5 - 8.1 g/dL   Albumin 4.4 3.5 - 5.0 g/dL   AST 32 15 - 41 U/L   ALT 27 0 - 44 U/L   Alkaline Phosphatase 56 38 - 126 U/L   Total Bilirubin 1.0 0.3 - 1.2 mg/dL   GFR, Estimated >46 >27 mL/min   Anion gap 11 5 - 15  CBC  Result Value Ref Range   WBC 11.7 (H) 4.0 - 10.5 K/uL   RBC 5.15 4.22 - 5.81 MIL/uL   Hemoglobin 14.9 13.0 - 17.0 g/dL   HCT 03.5 00.9 - 38.1 %   MCV 87.4 80.0 - 100.0 fL   MCH 28.9 26.0 - 34.0 pg   MCHC 33.1 30.0 - 36.0 g/dL   RDW 82.9 93.7 - 16.9 %   Platelets 305 150  - 400 K/uL   nRBC 0.0 0.0 - 0.2 %   CT Angio Head W/Cm &/Or Wo Cm  Result Date: 05/28/2020 CLINICAL DATA:  Initial evaluation for acute vertigo, nausea, vomiting. EXAM: CT ANGIOGRAPHY HEAD TECHNIQUE: Multidetector CT imaging of the head was performed using the standard protocol during bolus administration of intravenous contrast. Multiplanar CT image reconstructions and MIPs were obtained to evaluate the vascular anatomy. CONTRAST:  42mL OMNIPAQUE IOHEXOL 350 MG/ML SOLN COMPARISON:  Prior study from 01/29/2005. FINDINGS: CT HEAD Brain: Cerebral volume within normal limits. No acute intracranial hemorrhage. No acute large vessel territory infarct. No mass lesion, midline shift or mass effect. No hydrocephalus or extra-axial fluid collection. Vascular: No hyperdense vessel. Scattered vascular calcifications noted within the carotid siphons. Skull: Scalp soft tissues and calvarium within normal limits. Sinuses: Moderate mucosal thickening noted within the visualized sphenoethmoidal sinuses as well as the left frontal sinus, likely allergic/inflammatory nature. Mastoid air cells are clear. Orbits: Globes and orbital soft tissues demonstrate no acute finding. CTA HEAD Anterior circulation: Visualized distal cervical segments of the internal  carotid arteries are widely patent bilaterally. Petrous segments widely patent. Minimal for age atheromatous plaque within the carotid siphons without stenosis. A1 segments patent bilaterally. Normal anterior communicating artery complex. Anterior cerebral arteries patent to their distal aspects without stenosis. No M1 stenosis or occlusion. Normal MCA bifurcations. Distal MCA branches well perfused and symmetric. Posterior circulation: Both V4 segments patent to the vertebrobasilar junction without stenosis. Right PICA origin patent and normal. Left PICA origin not visualized. Basilar patent to its distal aspect without stenosis. Superior cerebellar arteries patent bilaterally.  Both PCA supplied via the basilar as well as prominent bilateral posterior communicating arteries. Both PCAs well perfused to their distal aspects. Venous sinuses: Patent. Anatomic variants: None significant.  No intracranial aneurysm. IMPRESSION: CT HEAD IMPRESSION: 1. Normal head CT.  No acute intracranial abnormality identified. 2. Moderate paranasal sinus disease, likely allergic/inflammatory in nature. CTA HEAD IMPRESSION: Normal CTA of the head. No large vessel occlusion, hemodynamically significant stenosis, or other acute vascular abnormality. No intracranial aneurysm. Electronically Signed   By: Rise Mu M.D.   On: 05/28/2020 23:37   MR BRAIN WO CONTRAST  Result Date: 05/29/2020 CLINICAL DATA:  Initial evaluation for acute dizziness, ataxia. EXAM: MRI HEAD WITHOUT CONTRAST TECHNIQUE: Multiplanar, multiecho pulse sequences of the brain and surrounding structures were obtained without intravenous contrast. COMPARISON:  Prior CTA from 05/28/2020 FINDINGS: Brain: Cerebral volume within normal limits for age. No significant cerebral white matter disease. There is subtle somewhat ill-defined diffusion signal abnormality involving the central aspect of the midbrain (series 5, image 66). Area of involvement measures up to approximately 8 mm. Question of associated signal loss on ADC map (series 6, image 18). Given the patient's history, finding is suspicious for possible subtle acute small vessel ischemia. No associated hemorrhage or mass effect. No other diffusion abnormality to suggest acute or subacute ischemia. Gray-white matter differentiation otherwise maintained. No encephalomalacia to suggest chronic cortical infarction elsewhere within the brain. No other evidence for acute or chronic intracranial hemorrhage. No mass lesion, midline shift or mass effect. No hydrocephalus or extra-axial fluid collection. Pituitary gland suprasellar region within normal limits. Midline structures intact.  Vascular: Major intracranial vascular flow voids are maintained. Skull and upper cervical spine: Craniocervical junction within normal limits. Bone marrow signal intensity normal. No scalp soft tissue abnormality. Sinuses/Orbits: Globes and orbital soft tissues within normal limits. Moderate mucosal thickening noted throughout the paranasal sinuses, chronic in appearance. No mastoid effusion. Inner ear structures grossly normal. Other: None. IMPRESSION: 1. Subtle 8 mm ill-defined diffusion signal abnormality involving the central aspect of the midbrain, suspicious for possible acute small vessel ischemia given the patient's history. No associated hemorrhage or mass effect. 2. Otherwise normal brain MRI for age. 3. Moderate chronic paranasal sinus disease. Electronically Signed   By: Rise Mu M.D.   On: 05/29/2020 02:37      Antony Madura, Cordelia Poche 05/29/20 [id number]    Nira Conn, MD 05/29/20 [phone]

## 2020-05-29 NOTE — Progress Notes (Signed)
Care started prior to midnight in the emergency room and patient was admitted early this morning after midnight by Dr. Marcial Pacas opiate and I am in current agreement with this assessment and plan.  Additional changes to the plan of care been made accordingly.  The patient is a 60 year old Hispanic male who does not speak any English with a past medical history significant for but not limited to hypertension, obstructive sleep apnea as well as other comorbidities who presented to the emergency department for evaluation of acute onset dizziness, nausea vomiting.  Patient stated he was in his usual state of health at work when he developed acute sudden dizziness and felt as if the room was spinning and this was associated with some nausea and vomiting.  He denies any recent fevers, chills or palpitations and did not have any change in vision or hearing and denies any headache.  Because of the symptoms EMS was called and he was brought to the ED and Zofran was administered in the ambulance.  He is found to be afebrile and saturating well on room air and EKG showed sinus rhythm.  CTA of the head was done and was normal and he was sent from [facility] to [facility] for an MRI of the brain which was done and showed concern for subtle 8 mm ill-defined diffusion signal abnormality involving the central midbrain suspicious for acute ischemia.  COVID-19 testing screening still not resulted neurology was consulted by the ED and they are recommending a full stroke work-up including but not limited to TTE and carotid ultrasound as well as continuing cardiac telemetry, PT OT and speech as well as starting the patient on aspirin 325 mg p.o. daily as well as atorvastatin and allowing for permissive hypertension.  Stroke team will be following and currently he is being treated and admitted for the following but not limited to:  Acute CVA  -Presents with acute-onset dizziness and N/V and has MRI findings concerning for acute  ischemic CVA  -Head CTA is normal  -Neurology consulting and much appreciated  -Continue cardiac monitoring, frequent neuro checks, NPO pending swallow screen, consult PT/OT/SLP, check carotid imaging, echocardiogram, A1c, and lipids  -Start ASA 325 and Atorvastatin  -Further Care per Stroke Team  Hypertension  -Hold antihypertensives initially   Leukocytosis -Patient's WBC yesterday AM was 11.7 and likely 2/2 to above -Repeat is Pending -Continue to Monitor WBC  Hyperglycemia -Patient's Blood Sugar yesterday on Admission was 125 -Check HbA1c -Continue to Monitor Blood Sugars carefully and if necessary will place on Sensitive Novolog SSI  COVID 19 Positive -ICurrently Asymptomatic from a Respiratory Standpoint -Check Inflammatory Markers -Check CXR -Consider starting Remdesivir and Steroids based on findings on CXR -Airborne and Contact Precautions  -Prone as Able -May consider Monoclonal Antibody if available  We will continue to monitor the patient's clinical response to intervention and repeat blood work and imaging of follow-up neurology recommendations

## 2020-05-29 NOTE — Progress Notes (Signed)
  Echocardiogram 2D Echocardiogram has been performed.  Manuel Peters 05/29/2020, 2:33 PM

## 2020-05-29 NOTE — ED Notes (Signed)
Attempted to call report

## 2020-05-29 NOTE — Progress Notes (Signed)
05/29/20 1358  PT Evaluation Information  Last PT Received On 05/29/20  Assistance Needed +1  History of Present Illness 60 y.o. male with medical history significant for hypertension and OSA, now presenting to the emergency department for evaluation of acute onset dizziness, nausea, and vomiting. MRI + 72mm diffusion abnormality involving central area of the midbrain; vaccinated; Covid +.  Precautions  Precautions Fall  Restrictions  Weight Bearing Restrictions No  Home Living  Family/patient expects to be discharged to: Private residence  Living Arrangements Spouse/significant other;Children  Available Help at Discharge Family;Available 24 hours/day  Type of Home House  Home Access Stairs to enter  Entrance Stairs-Number of Steps 2  Entrance Stairs-Rails None  Home Layout One level  Bathroom Shower/Tub Tub/shower unit;Walk-in Pension scheme manager Yes  Home Equipment None  Prior Function  Level of Independence Independent  Comments drives; works with a Occupational psychologist Prefers language other than English (Spanish; speaks some english)  Pain Assessment  Pain Assessment No/denies pain  Cognition  Arousal/Alertness Awake/alert  Behavior During Therapy WFL for tasks assessed/performed  Overall Cognitive Status Within Functional Limits for tasks assessed  Upper Extremity Assessment  Upper Extremity Assessment Defer to OT evaluation  Lower Extremity Assessment  Lower Extremity Assessment Overall WFL for tasks assessed  Cervical / Trunk Assessment  Cervical / Trunk Assessment Normal  Bed Mobility  Overal bed mobility Needs Assistance  Bed Mobility Supine to Sit;Sit to Supine  Supine to sit Min guard  Sit to supine Supervision  General bed mobility comments Min guard for safety to come to sitting. Pt reporting mild dizziness.  Transfers  Overall transfer level Needs assistance  Equipment used Rolling walker  (2 wheeled)  Transfers Sit to/from Stand  Sit to Stand Min guard  General transfer comment Min guard for safety. No physical assist required.  Ambulation/Gait  Ambulation/Gait assistance Min guard  Gait Distance (Feet) 10 Feet  Assistive device Rolling walker (2 wheeled)  Gait Pattern/deviations Step-through pattern;Decreased stride length  General Gait Details Worked on visually fixating on objects to help with vertigo. Worked on Arts development officer room with RW. Min guard for safety. No physical assist required.  Gait velocity Decreased  Balance  Overall balance assessment Needs assistance  Sitting-balance support No upper extremity supported  Sitting balance-Leahy Scale Good  Standing balance support Bilateral upper extremity supported  Standing balance-Leahy Scale Poor  Standing balance comment reliant on BUE support  General Comments  General comments (skin integrity, edema, etc.) Noted centralized nystagmus and nystagmus when visually tracking to R, L, up and down. Worse when looking to the R.  PT - End of Session  Equipment Utilized During Treatment Gait belt  Activity Tolerance Patient tolerated treatment well  Patient left in bed;with call bell/phone within reach;with family/visitor present (on stretcher in ED)  Nurse Communication Mobility status  PT Assessment  PT Recommendation/Assessment Patient needs continued PT services  PT Visit Diagnosis Other abnormalities of gait and mobility (R26.89);Dizziness and giddiness (R42);Other symptoms and signs involving the nervous system (R29.898)  PT Problem List Decreased balance;Decreased activity tolerance;Decreased mobility;Decreased knowledge of use of DME;Decreased knowledge of precautions  PT Plan  PT Frequency (ACUTE ONLY) Min 3X/week  PT Treatment/Interventions (ACUTE ONLY) DME instruction;Gait training;Stair training;Functional mobility training;Therapeutic activities;Therapeutic exercise;Balance training;Patient/family  education;Neuromuscular re-education  AM-PAC PT "6 Clicks" Mobility Outcome Measure (Version 2)  Help needed turning from your back to your side while in a flat bed without using bedrails? 4  Help needed moving from lying on your back to sitting on the side of a flat bed without using bedrails? 3  Help needed moving to and from a bed to a chair (including a wheelchair)? 3  Help needed standing up from a chair using your arms (e.g., wheelchair or bedside chair)? 3  Help needed to walk in hospital room? 3  Help needed climbing 3-5 steps with a railing?  3  6 Click Score 19  Consider Recommendation of Discharge To: Home with [facility]  PT Recommendation  Follow Up Recommendations Outpatient PT;Supervision for mobility/OOB (neuro outpatient PT)  PT equipment Rolling walker with 5" wheels  Individuals Consulted  Consulted and Agree with Results and Recommendations Patient;Family member/caregiver  Family Member Consulted wife  Acute Rehab PT Goals  Patient Stated Goal to get better  PT Goal Formulation With patient  Time For Goal Achievement 06/12/20  Potential to Achieve Goals Good  PT Time Calculation  PT Start Time (ACUTE ONLY) 1045  PT Stop Time (ACUTE ONLY) 1105  PT Time Calculation (min) (ACUTE ONLY) 20 min  PT General Charges  $$ ACUTE PT VISIT 1 Visit  PT Evaluation  $PT Eval Moderate Complexity 1 Mod  Written Expression  Dominant Hand Left    Pt admitted secondary to problem above with deficits below. Pt requiring min guard A for mobility tasks using RW. Noted central nystagmus and nystagmus with visual tracking to L, R, up and down (worse when looking right). Worked on gaze stabilization when performing mobility tasks in room in ED. Mobility limited to room as pt with COVID. Feel pt would benefit from neuro outpatient PT and use of RW to increase stability. Will continue to follow acutely.   Farley Ly, PT, DPT  Acute Rehabilitation Services  Pager: [phone] Office: [phone]

## 2020-05-29 NOTE — Progress Notes (Addendum)
STROKE TEAM PROGRESS NOTE   INTERVAL HISTORY No acute events since arrival. Wife is at bedside. Patient reports he has no new symptoms of concern. He describes the ongoing dizziness (room spinning sensation) as improved since yesterday. Denies any further nausea or vomiting. Has not been out of bed since arrival. We discussed his stroke diagnosis and plan of care. He is anxious regarding stroke due to his family history of stroke which includes mother and brother dying of stroke and his remaining sibling paralyzed from stroke and also his COVID positivity. He states understanding of need for ongoing work up of both conditions. Questions answered.   Vitals:   05/29/20 0110 05/29/20 0350 05/29/20 0554 05/29/20 1027  BP: 140/82 126/75 131/77 (!) 151/91  Pulse: 77 73 77 81  Resp: 19 18 18 20   Temp: (!) 97.5 F (36.4 C)     TempSrc:      SpO2: 98% 96% 95% 97%   CBC:  Recent Labs  Lab 05/28/20 1545 05/29/20 1050  WBC 11.7* 8.0  NEUTROABS  --  5.8  HGB 14.9 14.0  HCT 45.0 43.3  MCV 87.4 87.7  PLT 305 313   Basic Metabolic Panel:  Recent Labs  Lab 05/28/20 1545 05/29/20 1050  NA 137 139  K 4.3 3.7  CL 100 103  CO2 26 27  GLUCOSE 125* 98  BUN 22 14  CREATININE 1.05 1.01  CALCIUM 8.9 9.0  MG  --  2.3  PHOS  --  3.0   Lipid Panel: No results for input(s): CHOL, TRIG, HDL, CHOLHDL, VLDL, LDLCALC in the last 168 hours. HgbA1c: No results for input(s): HGBA1C in the last 168 hours. Urine Drug Screen: No results for input(s): LABOPIA, COCAINSCRNUR, LABBENZ, AMPHETMU, THCU, LABBARB in the last 168 hours.  Alcohol Level No results for input(s): ETH in the last 168 hours.  IMAGING past 24 hours CT Angio Head W/Cm &/Or Wo Cm  Result Date: 05/28/2020 CLINICAL DATA:  Initial evaluation for acute vertigo, nausea, vomiting. EXAM: CT ANGIOGRAPHY HEAD TECHNIQUE: Multidetector CT imaging of the head was performed using the standard protocol during bolus administration of intravenous  contrast. Multiplanar CT image reconstructions and MIPs were obtained to evaluate the vascular anatomy. CONTRAST:  30mL OMNIPAQUE IOHEXOL 350 MG/ML SOLN COMPARISON:  Prior study from 01/29/2005. FINDINGS: CT HEAD Brain: Cerebral volume within normal limits. No acute intracranial hemorrhage. No acute large vessel territory infarct. No mass lesion, midline shift or mass effect. No hydrocephalus or extra-axial fluid collection. Vascular: No hyperdense vessel. Scattered vascular calcifications noted within the carotid siphons. Skull: Scalp soft tissues and calvarium within normal limits. Sinuses: Moderate mucosal thickening noted within the visualized sphenoethmoidal sinuses as well as the left frontal sinus, likely allergic/inflammatory nature. Mastoid air cells are clear. Orbits: Globes and orbital soft tissues demonstrate no acute finding. CTA HEAD Anterior circulation: Visualized distal cervical segments of the internal carotid arteries are widely patent bilaterally. Petrous segments widely patent. Minimal for age atheromatous plaque within the carotid siphons without stenosis. A1 segments patent bilaterally. Normal anterior communicating artery complex. Anterior cerebral arteries patent to their distal aspects without stenosis. No M1 stenosis or occlusion. Normal MCA bifurcations. Distal MCA branches well perfused and symmetric. Posterior circulation: Both V4 segments patent to the vertebrobasilar junction without stenosis. Right PICA origin patent and normal. Left PICA origin not visualized. Basilar patent to its distal aspect without stenosis. Superior cerebellar arteries patent bilaterally. Both PCA supplied via the basilar as well as prominent bilateral posterior communicating arteries.  Both PCAs well perfused to their distal aspects. Venous sinuses: Patent. Anatomic variants: None significant.  No intracranial aneurysm. IMPRESSION: CT HEAD IMPRESSION: 1. Normal head CT.  No acute intracranial abnormality  identified. 2. Moderate paranasal sinus disease, likely allergic/inflammatory in nature. CTA HEAD IMPRESSION: Normal CTA of the head. No large vessel occlusion, hemodynamically significant stenosis, or other acute vascular abnormality. No intracranial aneurysm. Electronically Signed   By: Rise Mu M.D.   On: 05/28/2020 23:37   MR BRAIN WO CONTRAST  Result Date: 05/29/2020 CLINICAL DATA:  Initial evaluation for acute dizziness, ataxia. EXAM: MRI HEAD WITHOUT CONTRAST TECHNIQUE: Multiplanar, multiecho pulse sequences of the brain and surrounding structures were obtained without intravenous contrast. COMPARISON:  Prior CTA from 05/28/2020 FINDINGS: Brain: Cerebral volume within normal limits for age. No significant cerebral white matter disease. There is subtle somewhat ill-defined diffusion signal abnormality involving the central aspect of the midbrain (series 5, image 66). Area of involvement measures up to approximately 8 mm. Question of associated signal loss on ADC map (series 6, image 18). Given the patient's history, finding is suspicious for possible subtle acute small vessel ischemia. No associated hemorrhage or mass effect. No other diffusion abnormality to suggest acute or subacute ischemia. Gray-white matter differentiation otherwise maintained. No encephalomalacia to suggest chronic cortical infarction elsewhere within the brain. No other evidence for acute or chronic intracranial hemorrhage. No mass lesion, midline shift or mass effect. No hydrocephalus or extra-axial fluid collection. Pituitary gland suprasellar region within normal limits. Midline structures intact. Vascular: Major intracranial vascular flow voids are maintained. Skull and upper cervical spine: Craniocervical junction within normal limits. Bone marrow signal intensity normal. No scalp soft tissue abnormality. Sinuses/Orbits: Globes and orbital soft tissues within normal limits. Moderate mucosal thickening noted  throughout the paranasal sinuses, chronic in appearance. No mastoid effusion. Inner ear structures grossly normal. Other: None. IMPRESSION: 1. Subtle 8 mm ill-defined diffusion signal abnormality involving the central aspect of the midbrain, suspicious for possible acute small vessel ischemia given the patient's history. No associated hemorrhage or mass effect. 2. Otherwise normal brain MRI for age. 3. Moderate chronic paranasal sinus disease. Electronically Signed   By: Rise Mu M.D.   On: 05/29/2020 02:37   DG CHEST PORT 1 VIEW  Result Date: 05/29/2020 CLINICAL DATA:  COVID-19 positive.  Nausea and vomiting EXAM: PORTABLE CHEST 1 VIEW COMPARISON:  None. FINDINGS: Lungs are clear. Heart is upper normal in size with pulmonary vascularity normal. No adenopathy. There is aortic atherosclerosis. No bone lesions. IMPRESSION: No edema or airspace opacity. Heart upper normal in size. Aortic Atherosclerosis (ICD10-I70.0). Electronically Signed   By: Bretta Bang III M.D.   On: 05/29/2020 11:16   PHYSICAL EXAM Physical Exam  HEENT-  [place]/AT    Resp: on room air without increased work of breathing  Neurological Examination Mental Status: Awake and alert. Oriented x 5. Speech fluent without evidence of aphasia.  Able to follow all commands without difficulty. Cranial Nerves: II: Visual fields grossly normal. PERRL.  III,IV, VI: No ptosis. EOM are full, but in this context there is prominent conjugate right-beating nystagmus in all directions of gaze.  V,VII: Grimace is symmetric, facial temp sensation equal bilaterally VIII: hearing intact to voice IX,X: No hypophonia XI: Symmetric shoulder shrug XII: Midline tongue extension Motor: Right :  Upper extremity   5/5  Left:     Upper extremity   5/5             Lower extremity   5/5                                                  Lower extremity   5/5 No pronator drift Sensory: Temp and light touch  intact throughout, bilaterally Deep Tendon Reflexes: 2+ and symmetric throughout Cerebellar: No ataxia with FNF bilaterally  Gait: Deferred due to fall risk concerns. Sways slightly when seated, stating he feels unsteady.    ASSESSMENT/PLAN Manuel Peters is a 60 y.o. male with medical history significant for hypertension and OSA on CPAP, and recently seen by rheumatology for positive ANA. He also has a significant family history of stroke with mother and one brother dying d/t stroke and remaining brother s/p CVA with significant paralysis.  He presented to the emergency department for evaluation of acute onset dizziness, nausea, and vomiting.  Patient reports that he was in his usual state of health at work when he developed acute onset of dizziness, felt as though the room was spinning, and this was associated with nausea and vomiting. He was found to have significant nystagmus and truncal lean on exam upon arrival. Also found to be COVID positive.   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   CTA head & neck: Normal head CT.  No acute intracranial abnormality identified.   MRI: Subtle 8 mm ill-defined diffusion signal abnormality involving the central aspect of the midbrain, suspicious for possible acute small vessel ischemia given the patient's history. No associated hemorrhage or mass effect.  Carotid Doppler unremarkable  2D Echo PENDING  LDL PENDING  HgbA1c PENDING  UDS PENDING VTE prophylaxis is recommended.  Passed bedside swallow eval. On heart healthy diet.   Therapy recommendations:  CIR  Disposition:  TBD  COVID   Covid testing positive  CXR was without acute finding 1/27.   No fever, chills or coughing prior to admission.   WBC 11.7.   Management per primary team.   Hypertension  Home meds: Norvasc 10 mg daily  . Permissive hypertension (OK if < 220/120) but gradually normalize in 3-5 days . Long-term BP goal  normotensive  Hyperlipidemia  Home meds: None  LDL pending, goal < 70  On Lipitor 80  Continue statin on discharge  Other Stroke Risk Factors  Family hx stroke Pateint reports both siblings and mother +stroke. Mother passed away from stroke, one brother a year younger than him died from stroke and his remaining brother has significant paralysis s/p stroke   Obstructive sleep apnea on CPAP  Other Active Problems  ANA positive - seeing rheumatology  This plan of care was directed by Dr. Roda Shutters. Janey Genta, NP-C  Hospital day # 0  ATTENDING NOTE: I reviewed above note and agree with the assessment and plan. Pt was seen and examined.   60 year old male with history of hypertension, OSA on CPAP, family history of for stroke, ANA positive admitted for dizziness, nausea and vomiting.  CT no acute abnormality.  CTA head and neck unremarkable.  MRI showed midbrain small infarct.  2D echo pending.  A1c and LDL pending.  UDS pending.  Patient found to have Covid positive.  On exam, patient much improved  from yesterday, awake alert, orientated x3, no aphasia, follows simple commands.  No gaze palsy, however with right gaze, nystagmus with direction to the right, upper gaze also showed nystagmus direction to the right, left gaze and downward gaze no nystagmus.  No disconjugate gaze.  PERRL.  No significant facial droop, visual field intact.  Moving all extremities arm and leg equally, finger-to-nose and heel-to-shin intact, no dysmetria or ataxia.  Sensation symmetrical, gait not tested.  Patient stroke etiology likely due to small vessel disease.  Covid positive but patient asymptomatic.  Patient has no antiplatelet PTA, recommend aspirin and Plavix DAPT for 3 weeks and then aspirin alone.  On Lipitor 80.  PT/OT recommend CIR.  Stroke risk factor modification.  Will follow.  Marvel Plan, MD PhD Stroke Neurology 05/29/2020 5:41 PM    To contact Stroke Continuity provider, please  refer to [email or web address]. After hours, contact General Neurology

## 2020-05-29 NOTE — ED Notes (Signed)
Pt arrived via [facility] form [facility] for MRI

## 2020-05-29 NOTE — Consult Note (Signed)
NEURO HOSPITALIST CONSULT NOTE   Requestig physician: Dr. Antionette Char  Reason for Consult: Acute onset of vertigo with nausea and vomiting  History obtained from:  Patient and Chart     HPI:                                                                                                                                          Manuel Peters is an 60 y.o. male with a PMHx of HTN and sleep apnea who presented to the [facility] ED from work for evaluation of acute onset vertigo with N/V. He states that he went to use the bathroom while at work, but when he walked out, he was acutely vertiginous, felt nauseated and started to vomiting. His gait was unsteady due to the vertigo. He denies any focal weakness or sensory numbness. Also denies any double vision. While in the ED, he continued to have gait unsteadiness, requiring assistance. He was tranferred to the [facility] ED for an MRI, which revealed punctate foci of increased signal in the paramedian midbrain tegmentum bilaterally, suspicious for possible strokes. Neurology was consulted for further evaluation.   Past Medical History:  Diagnosis Date  . Elevated BP   . Hypertension   . Sinus congestion   . Sleep apnea    does not use a cpap    Past Surgical History:  Procedure Laterality Date  . COLONOSCOPY    . HERNIA REPAIR  1980s   inguinal   . NASAL SEPTOPLASTY W/ TURBINOPLASTY Bilateral 07/11/2014   Procedure: NASAL SEPTOPLASTY WITH BILATERAL INFERIOR TURBINATE REDUCTION;  Surgeon: Osborn Coho, MD;  Location: [facility];  Service: ENT;  Laterality: Bilateral;  . SINUS ENDO W/FUSION Bilateral 07/11/2014   Procedure: BILATERAL ENDOSCOPIC SINUS SURGERY/RESECTION OF INTRA NASAL POLYPS WITH FUSION SCAN;  Surgeon: Osborn Coho, MD;  Location:  SURGERY CENTER;  Service: ENT;  Laterality: Bilateral;    Family History  Problem Relation Age of Onset  . Hypertension Other        M, B              Social  History:  reports that he has never smoked. He has never used smokeless tobacco. He reports current alcohol use. He reports that he does not use drugs.  No Known Allergies  MEDICATIONS:  No current facility-administered medications on file prior to encounter.   Current Outpatient Medications on File Prior to Encounter  Medication Sig Dispense Refill  . amLODipine (NORVASC) 5 MG tablet Take 5 mg by mouth daily.    Marland Kitchen amoxicillin-clavulanate (AUGMENTIN) 500-125 MG per tablet Take 1 tablet (500 mg total) by mouth 2 (two) times daily. 20 tablet 0  . cyclobenzaprine (FLEXERIL) 10 MG tablet Take 1 tablet (10 mg total) by mouth 2 (two) times daily as needed for muscle spasms. 20 tablet 0  . HYDROcodone-acetaminophen (NORCO) 5-325 MG per tablet Take 1-2 tablets by mouth every 6 (six) hours as needed. 30 tablet 0  . ibuprofen (ADVIL,MOTRIN) 600 MG tablet Take 1 tablet (600 mg total) by mouth every 6 (six) hours as needed. 30 tablet 0  . lisinopril-hydrochlorothiazide (PRINZIDE,ZESTORETIC) 20-25 MG per tablet Take 1 tablet by mouth daily.      ROS:                                                                                                                                       As per HPI. Denies any additional symptoms.    Blood pressure 140/82, pulse 77, temperature (!) 97.5 F (36.4 C), resp. rate 19, SpO2 98 %.   General Examination:                                                                                                       Physical Exam  HEENT-  [place]/AT    Lungs- Respirations unlabored Extremities- No edema  Neurological Examination Mental Status: Awake and alert. Oriented x 5. Speech fluent without evidence of aphasia.  Able to follow all commands without difficulty. Cranial Nerves: II: Visual fields grossly normal. PERRL.  III,IV, VI: No ptosis. EOM are full,  but in this context there is prominent conjugate right-beating nystagmus in all directions of gaze.  V,VII: Grimace is symmetric, facial temp sensation equal bilaterally VIII: hearing intact to voice IX,X: No hypophonia XI: Symmetric shoulder shrug XII: Midline tongue extension Motor: Right : Upper extremity   5/5    Left:     Upper extremity   5/5  Lower extremity   5/5     Lower extremity   5/5 No pronator drift Sensory: Temp and light touch intact throughout, bilaterally Deep Tendon Reflexes: 2+ and symmetric throughout Cerebellar: No ataxia with FNF bilaterally  Gait: Deferred due to fall risk concerns. Sways slightly when seated, stating he feels unsteady.    Lab  Results: Basic Metabolic Panel: Recent Labs  Lab 05/28/20 1545  NA 137  K 4.3  CL 100  CO2 26  GLUCOSE 125*  BUN 22  CREATININE 1.05  CALCIUM 8.9    CBC: Recent Labs  Lab 05/28/20 1545  WBC 11.7*  HGB 14.9  HCT 45.0  MCV 87.4  PLT 305    Cardiac Enzymes: No results for input(s): CKTOTAL, CKMB, CKMBINDEX, TROPONINI in the last 168 hours.  Lipid Panel: No results for input(s): CHOL, TRIG, HDL, CHOLHDL, VLDL, LDLCALC in the last 168 hours.  Imaging: CT Angio Head W/Cm &/Or Wo Cm  Result Date: 05/28/2020 CLINICAL DATA:  Initial evaluation for acute vertigo, nausea, vomiting. EXAM: CT ANGIOGRAPHY HEAD TECHNIQUE: Multidetector CT imaging of the head was performed using the standard protocol during bolus administration of intravenous contrast. Multiplanar CT image reconstructions and MIPs were obtained to evaluate the vascular anatomy. CONTRAST:  31mL OMNIPAQUE IOHEXOL 350 MG/ML SOLN COMPARISON:  Prior study from 01/29/2005. FINDINGS: CT HEAD Brain: Cerebral volume within normal limits. No acute intracranial hemorrhage. No acute large vessel territory infarct. No mass lesion, midline shift or mass effect. No hydrocephalus or extra-axial fluid collection. Vascular: No hyperdense vessel. Scattered vascular  calcifications noted within the carotid siphons. Skull: Scalp soft tissues and calvarium within normal limits. Sinuses: Moderate mucosal thickening noted within the visualized sphenoethmoidal sinuses as well as the left frontal sinus, likely allergic/inflammatory nature. Mastoid air cells are clear. Orbits: Globes and orbital soft tissues demonstrate no acute finding. CTA HEAD Anterior circulation: Visualized distal cervical segments of the internal carotid arteries are widely patent bilaterally. Petrous segments widely patent. Minimal for age atheromatous plaque within the carotid siphons without stenosis. A1 segments patent bilaterally. Normal anterior communicating artery complex. Anterior cerebral arteries patent to their distal aspects without stenosis. No M1 stenosis or occlusion. Normal MCA bifurcations. Distal MCA branches well perfused and symmetric. Posterior circulation: Both V4 segments patent to the vertebrobasilar junction without stenosis. Right PICA origin patent and normal. Left PICA origin not visualized. Basilar patent to its distal aspect without stenosis. Superior cerebellar arteries patent bilaterally. Both PCA supplied via the basilar as well as prominent bilateral posterior communicating arteries. Both PCAs well perfused to their distal aspects. Venous sinuses: Patent. Anatomic variants: None significant.  No intracranial aneurysm. IMPRESSION: CT HEAD IMPRESSION: 1. Normal head CT.  No acute intracranial abnormality identified. 2. Moderate paranasal sinus disease, likely allergic/inflammatory in nature. CTA HEAD IMPRESSION: Normal CTA of the head. No large vessel occlusion, hemodynamically significant stenosis, or other acute vascular abnormality. No intracranial aneurysm. Electronically Signed   By: Rise Mu M.D.   On: 05/28/2020 23:37   MR BRAIN WO CONTRAST  Result Date: 05/29/2020 CLINICAL DATA:  Initial evaluation for acute dizziness, ataxia. EXAM: MRI HEAD WITHOUT  CONTRAST TECHNIQUE: Multiplanar, multiecho pulse sequences of the brain and surrounding structures were obtained without intravenous contrast. COMPARISON:  Prior CTA from 05/28/2020 FINDINGS: Brain: Cerebral volume within normal limits for age. No significant cerebral white matter disease. There is subtle somewhat ill-defined diffusion signal abnormality involving the central aspect of the midbrain (series 5, image 66). Area of involvement measures up to approximately 8 mm. Question of associated signal loss on ADC map (series 6, image 18). Given the patient's history, finding is suspicious for possible subtle acute small vessel ischemia. No associated hemorrhage or mass effect. No other diffusion abnormality to suggest acute or subacute ischemia. Gray-white matter differentiation otherwise maintained. No encephalomalacia to suggest chronic cortical infarction elsewhere within the  brain. No other evidence for acute or chronic intracranial hemorrhage. No mass lesion, midline shift or mass effect. No hydrocephalus or extra-axial fluid collection. Pituitary gland suprasellar region within normal limits. Midline structures intact. Vascular: Major intracranial vascular flow voids are maintained. Skull and upper cervical spine: Craniocervical junction within normal limits. Bone marrow signal intensity normal. No scalp soft tissue abnormality. Sinuses/Orbits: Globes and orbital soft tissues within normal limits. Moderate mucosal thickening noted throughout the paranasal sinuses, chronic in appearance. No mastoid effusion. Inner ear structures grossly normal. Other: None. IMPRESSION: 1. Subtle 8 mm ill-defined diffusion signal abnormality involving the central aspect of the midbrain, suspicious for possible acute small vessel ischemia given the patient's history. No associated hemorrhage or mass effect. 2. Otherwise normal brain MRI for age. 3. Moderate chronic paranasal sinus disease. Electronically Signed   By: Rise Mu M.D.   On: 05/29/2020 02:37    Assessment: 60 year old male with equivocal signal abnormality within the midbrain on MRI, read as suspicious for possible acute small vessel ischemia by Radiology 1. Exam reveals prominent right-beating nystagmus in all directions of gaze, as well as truncal swaying when seated, which patient states is due to a sensation of unsteadiness. No ataxia noted. 2. CTA head: Normal CTA of the head. No large vessel occlusion, hemodynamically significant stenosis, or other acute vascular abnormality. No intracranial aneurysm 3. MRI brain: Subtle 8 mm ill-defined diffusion signal abnormality involving the central aspect of the midbrain, suspicious for possible acute small vessel ischemia given the patient's history. No associated hemorrhage or mass effect.   Recommendations: 1. Stroke work up to include TTE and carotid ultrasound.  2. Cardiac telemetry.  3. PT/OT/Speech 4. ASA 325 mg po qd 5. Start atorvastatin.  6. Permissive HTN x 24 hours.  7. Stroke team to follow.     Electronically signed: Dr. Caryl Pina 05/29/2020, 3:08 AM

## 2020-05-29 NOTE — Progress Notes (Signed)
VASCULAR LAB    Carotid duplex has been performed.  See CV proc for preliminary results.   Dublin Cantero, RVT 05/29/2020, 4:27 PM

## 2020-05-29 NOTE — H&P (Signed)
History and Physical    Breccan Lampman UOR:[id number] DOB: 04-08-1961 DOA: 05/28/2020  PCP: Cheron Schaumann., MD   Patient coming from: Home   Chief Complaint: Dizziness, N/V   HPI: Jonas Worrell is a 60 y.o. male with medical history significant for hypertension and OSA, now presenting to the emergency department for evaluation of acute onset dizziness, nausea, and vomiting.  Patient reports that he was in his usual state of health at work today when he developed acute onset of dizziness, felt as though the room was spinning, and this was associated with nausea and vomiting.  He denies any recent fevers, chills, chest pain, palpitations, headache, change in vision or hearing, or focal numbness or weakness.  EMS was called, Zofran was administered, and patient was brought into the ED.  ED Course: Upon arrival to the ED, patient is found to be afebrile, saturating well on room air, and with stable blood pressure. EKG features sinus rhythm. Chemistry panel unremarkable and CBC with mild leukocytosis. CTA head was a normal study. Patient was sent from [facility] to [facility] for MRI brain which is concerning for subtle 8 mm ill-defined diffusion signal abnormality involving the central midbrain suspicious for acute ischemia. Patient was treated with Zofran and meclizine in the ED. COVID-19 screening test not yet resulted. Neurology was consulted by the ED physician and recommends medical admission.  Review of Systems:  All other systems reviewed and apart from HPI, are negative.  Past Medical History:  Diagnosis Date  . Elevated BP   . Hypertension   . Sinus congestion   . Sleep apnea    does not use a cpap    Past Surgical History:  Procedure Laterality Date  . COLONOSCOPY    . HERNIA REPAIR  1980s   inguinal   . NASAL SEPTOPLASTY W/ TURBINOPLASTY Bilateral 07/11/2014   Procedure: NASAL SEPTOPLASTY WITH BILATERAL INFERIOR TURBINATE REDUCTION;  Surgeon: Osborn Coho, MD;   Location: [facility];  Service: ENT;  Laterality: Bilateral;  . SINUS ENDO W/FUSION Bilateral 07/11/2014   Procedure: BILATERAL ENDOSCOPIC SINUS SURGERY/RESECTION OF INTRA NASAL POLYPS WITH FUSION SCAN;  Surgeon: Osborn Coho, MD;  Location:  SURGERY CENTER;  Service: ENT;  Laterality: Bilateral;    Social History:   reports that he has never smoked. He has never used smokeless tobacco. He reports current alcohol use. He reports that he does not use drugs.  No Known Allergies  Family History  Problem Relation Age of Onset  . Hypertension Other        M, B     Prior to Admission medications   Medication Sig Start Date End Date Taking? Authorizing Provider  meclizine (ANTIVERT) 25 MG tablet Take 1 tablet (25 mg total) by mouth 3 (three) times daily as needed for dizziness. 05/28/20  Yes Terrilee Files, MD  amLODipine (NORVASC) 5 MG tablet Take 5 mg by mouth daily.    [provider]  amoxicillin-clavulanate (AUGMENTIN) 500-125 MG per tablet Take 1 tablet (500 mg total) by mouth 2 (two) times daily. 07/11/14   Osborn Coho, MD  cyclobenzaprine (FLEXERIL) 10 MG tablet Take 1 tablet (10 mg total) by mouth 2 (two) times daily as needed for muscle spasms. 01/03/15   Mirian Mo, MD  HYDROcodone-acetaminophen (NORCO) 5-325 MG per tablet Take 1-2 tablets by mouth every 6 (six) hours as needed. 07/11/14   Osborn Coho, MD  ibuprofen (ADVIL,MOTRIN) 600 MG tablet Take 1 tablet (600 mg total) by  mouth every 6 (six) hours as needed. 01/03/15   Mirian Mo, MD  lisinopril-hydrochlorothiazide (PRINZIDE,ZESTORETIC) 20-25 MG per tablet Take 1 tablet by mouth daily.    [provider]    Physical Exam: Vitals:   05/28/20 2209 05/29/20 0110 05/29/20 0350 05/29/20 0554  BP: (!) 157/102 140/82 126/75 131/77  Pulse: 78 77 73 77  Resp: 18 19 18 18   Temp:  (!) 97.5 F (36.4 C)    TempSrc:      SpO2: 96% 98% 96% 95%    Constitutional: NAD, calm   Eyes: PERTLA, lids and conjunctivae normal ENMT: Mucous membranes are moist. Posterior pharynx clear of any exudate or lesions.   Neck: normal, supple, no masses, no thyromegaly Respiratory:  no wheezing, no crackles. No accessory muscle use.  Cardiovascular: S1 & S2 heard, regular rate and rhythm. No extremity edema. No significant JVD. Abdomen: No distension, no tenderness, soft. Bowel sounds active.  Musculoskeletal: no clubbing / cyanosis. No joint deformity upper and lower extremities.   Skin: no significant rashes, lesions, ulcers. Warm, dry, well-perfused. Neurologic: CN 2-12 grossly intact. Sensation intact. Strength 5/5 in all 4 limbs.  Psychiatric: Alert and oriented to person, place, and situation. Very pleasant and cooperative.    Labs and Imaging on Admission: I have personally reviewed following labs and imaging studies  CBC: Recent Labs  Lab 05/28/20 1545  WBC 11.7*  HGB 14.9  HCT 45.0  MCV 87.4  PLT 305   Basic Metabolic Panel: Recent Labs  Lab 05/28/20 1545  NA 137  K 4.3  CL 100  CO2 26  GLUCOSE 125*  BUN 22  CREATININE 1.05  CALCIUM 8.9   GFR: CrCl cannot be calculated (Unknown ideal weight.). Liver Function Tests: Recent Labs  Lab 05/28/20 1545  AST 32  ALT 27  ALKPHOS 56  BILITOT 1.0  PROT 7.7  ALBUMIN 4.4   Recent Labs  Lab 05/28/20 1545  LIPASE 35   No results for input(s): AMMONIA in the last 168 hours. Coagulation Profile: No results for input(s): INR, PROTIME in the last 168 hours. Cardiac Enzymes: No results for input(s): CKTOTAL, CKMB, CKMBINDEX, TROPONINI in the last 168 hours. BNP (last 3 results) No results for input(s): PROBNP in the last 8760 hours. HbA1C: No results for input(s): HGBA1C in the last 72 hours. CBG: No results for input(s): GLUCAP in the last 168 hours. Lipid Profile: No results for input(s): CHOL, HDL, LDLCALC, TRIG, CHOLHDL, LDLDIRECT in the last 72 hours. Thyroid Function Tests: No results for  input(s): TSH, T4TOTAL, FREET4, T3FREE, THYROIDAB in the last 72 hours. Anemia Panel: No results for input(s): VITAMINB12, FOLATE, FERRITIN, TIBC, IRON, RETICCTPCT in the last 72 hours. Urine analysis: No results found for: COLORURINE, APPEARANCEUR, LABSPEC, PHURINE, GLUCOSEU, HGBUR, BILIRUBINUR, KETONESUR, PROTEINUR, UROBILINOGEN, NITRITE, LEUKOCYTESUR Sepsis Labs: @LABRCNTIP (procalcitonin:4,lacticidven:4) )No results found for this or any previous visit (from the past 240 hour(s)).   Radiological Exams on Admission: CT Angio Head W/Cm &/Or Wo Cm  Result Date: 05/28/2020 CLINICAL DATA:  Initial evaluation for acute vertigo, nausea, vomiting. EXAM: CT ANGIOGRAPHY HEAD TECHNIQUE: Multidetector CT imaging of the head was performed using the standard protocol during bolus administration of intravenous contrast. Multiplanar CT image reconstructions and MIPs were obtained to evaluate the vascular anatomy. CONTRAST:  26mL OMNIPAQUE IOHEXOL 350 MG/ML SOLN COMPARISON:  Prior study from 01/29/2005. FINDINGS: CT HEAD Brain: Cerebral volume within normal limits. No acute intracranial hemorrhage. No acute large vessel territory infarct. No mass lesion, midline shift or mass  effect. No hydrocephalus or extra-axial fluid collection. Vascular: No hyperdense vessel. Scattered vascular calcifications noted within the carotid siphons. Skull: Scalp soft tissues and calvarium within normal limits. Sinuses: Moderate mucosal thickening noted within the visualized sphenoethmoidal sinuses as well as the left frontal sinus, likely allergic/inflammatory nature. Mastoid air cells are clear. Orbits: Globes and orbital soft tissues demonstrate no acute finding. CTA HEAD Anterior circulation: Visualized distal cervical segments of the internal carotid arteries are widely patent bilaterally. Petrous segments widely patent. Minimal for age atheromatous plaque within the carotid siphons without stenosis. A1 segments patent bilaterally.  Normal anterior communicating artery complex. Anterior cerebral arteries patent to their distal aspects without stenosis. No M1 stenosis or occlusion. Normal MCA bifurcations. Distal MCA branches well perfused and symmetric. Posterior circulation: Both V4 segments patent to the vertebrobasilar junction without stenosis. Right PICA origin patent and normal. Left PICA origin not visualized. Basilar patent to its distal aspect without stenosis. Superior cerebellar arteries patent bilaterally. Both PCA supplied via the basilar as well as prominent bilateral posterior communicating arteries. Both PCAs well perfused to their distal aspects. Venous sinuses: Patent. Anatomic variants: None significant.  No intracranial aneurysm. IMPRESSION: CT HEAD IMPRESSION: 1. Normal head CT.  No acute intracranial abnormality identified. 2. Moderate paranasal sinus disease, likely allergic/inflammatory in nature. CTA HEAD IMPRESSION: Normal CTA of the head. No large vessel occlusion, hemodynamically significant stenosis, or other acute vascular abnormality. No intracranial aneurysm. Electronically Signed   By: Rise Mu M.D.   On: 05/28/2020 23:37   MR BRAIN WO CONTRAST  Result Date: 05/29/2020 CLINICAL DATA:  Initial evaluation for acute dizziness, ataxia. EXAM: MRI HEAD WITHOUT CONTRAST TECHNIQUE: Multiplanar, multiecho pulse sequences of the brain and surrounding structures were obtained without intravenous contrast. COMPARISON:  Prior CTA from 05/28/2020 FINDINGS: Brain: Cerebral volume within normal limits for age. No significant cerebral white matter disease. There is subtle somewhat ill-defined diffusion signal abnormality involving the central aspect of the midbrain (series 5, image 66). Area of involvement measures up to approximately 8 mm. Question of associated signal loss on ADC map (series 6, image 18). Given the patient's history, finding is suspicious for possible subtle acute small vessel ischemia. No  associated hemorrhage or mass effect. No other diffusion abnormality to suggest acute or subacute ischemia. Gray-white matter differentiation otherwise maintained. No encephalomalacia to suggest chronic cortical infarction elsewhere within the brain. No other evidence for acute or chronic intracranial hemorrhage. No mass lesion, midline shift or mass effect. No hydrocephalus or extra-axial fluid collection. Pituitary gland suprasellar region within normal limits. Midline structures intact. Vascular: Major intracranial vascular flow voids are maintained. Skull and upper cervical spine: Craniocervical junction within normal limits. Bone marrow signal intensity normal. No scalp soft tissue abnormality. Sinuses/Orbits: Globes and orbital soft tissues within normal limits. Moderate mucosal thickening noted throughout the paranasal sinuses, chronic in appearance. No mastoid effusion. Inner ear structures grossly normal. Other: None. IMPRESSION: 1. Subtle 8 mm ill-defined diffusion signal abnormality involving the central aspect of the midbrain, suspicious for possible acute small vessel ischemia given the patient's history. No associated hemorrhage or mass effect. 2. Otherwise normal brain MRI for age. 3. Moderate chronic paranasal sinus disease. Electronically Signed   By: Rise Mu M.D.   On: 05/29/2020 02:37    EKG: Independently reviewed. Sinus rhythm.   Assessment/Plan  1. Acute CVA  - Presents with acute-onset dizziness and N/V and has MRI findings concerning for acute ischemic CVA  - Head CTA is normal  - Neurology consulting  and much appreciated  - Continue cardiac monitoring, frequent neuro checks, NPO pending swallow screen, consult PT/OT/SLP, check carotid imaging, echocardiogram, A1c, and lipids  - Start ASA 325 and atorvastatin   2. Hypertension  - Hold antihypertensives initially    DVT prophylaxis: Lovenox  Code Status: Full  Level of Care: Level of care: Telemetry  Medical Family Communication: Discussed with patient  Disposition Plan:  Patient is from: Home  Anticipated d/c is to: Home  Anticipated d/c date is: 05/30/20 Patient currently: Pending carotid imaging, echo, therapy assessments  Consults called: Neurology  Admission status: Observation     Briscoe Deutscher, MD [facility]  05/29/2020, 6:30 AM

## 2020-05-30 ENCOUNTER — Other Ambulatory Visit: Payer: Self-pay

## 2020-05-30 DIAGNOSIS — I10 Essential (primary) hypertension: Secondary | ICD-10-CM | POA: Diagnosis not present

## 2020-05-30 DIAGNOSIS — I639 Cerebral infarction, unspecified: Secondary | ICD-10-CM | POA: Diagnosis not present

## 2020-05-30 LAB — COMPREHENSIVE METABOLIC PANEL
ALT: 25 U/L (ref 0–44)
AST: 21 U/L (ref 15–41)
Albumin: 3.2 g/dL — ABNORMAL LOW (ref 3.5–5.0)
Alkaline Phosphatase: 46 U/L (ref 38–126)
Anion gap: 9 (ref 5–15)
BUN: 13 mg/dL (ref 8–23)
CO2: 25 mmol/L (ref 22–32)
Calcium: 8.5 mg/dL — ABNORMAL LOW (ref 8.9–10.3)
Chloride: 104 mmol/L (ref 98–111)
Creatinine, Ser: 0.92 mg/dL (ref 0.61–1.24)
GFR, Estimated: >60 mL/min (ref >60)
Glucose, Bld: 90 mg/dL (ref 70–99)
Potassium: 3.8 mmol/L (ref 3.5–5.1)
Sodium: 138 mmol/L (ref 135–145)
Total Bilirubin: 1.6 mg/dL — ABNORMAL HIGH (ref 0.3–1.2)
Total Protein: 6.2 g/dL — ABNORMAL LOW (ref 6.5–8.1)

## 2020-05-30 LAB — RAPID URINE DRUG SCREEN, HOSP PERFORMED
Amphetamines: NOT DETECTED
Barbiturates: NOT DETECTED
Benzodiazepines: NOT DETECTED
Cocaine: NOT DETECTED
Opiates: NOT DETECTED
Tetrahydrocannabinol: NOT DETECTED

## 2020-05-30 LAB — PHOSPHORUS: Phosphorus: 2.6 mg/dL (ref 2.5–4.6)

## 2020-05-30 LAB — LIPID PANEL
Cholesterol: 191 mg/dL (ref 0–200)
HDL: 39 mg/dL — ABNORMAL LOW (ref >40)
LDL Cholesterol: 136 mg/dL — ABNORMAL HIGH (ref 0–99)
Total CHOL/HDL Ratio: 4.9 RATIO
Triglycerides: 82 mg/dL (ref <150)
VLDL: 16 mg/dL (ref 0–40)

## 2020-05-30 LAB — CBC WITH DIFFERENTIAL/PLATELET
Abs Immature Granulocytes: 0.02 10*3/uL (ref 0.00–0.07)
Basophils Absolute: 0 10*3/uL (ref 0.0–0.1)
Basophils Relative: 0 %
Eosinophils Absolute: 0.1 10*3/uL (ref 0.0–0.5)
Eosinophils Relative: 1 %
HCT: 42.4 % (ref 39.0–52.0)
Hemoglobin: 13.6 g/dL (ref 13.0–17.0)
Immature Granulocytes: 0 %
Lymphocytes Relative: 26 %
Lymphs Abs: 1.5 10*3/uL (ref 0.7–4.0)
MCH: 28.3 pg (ref 26.0–34.0)
MCHC: 32.1 g/dL (ref 30.0–36.0)
MCV: 88.1 fL (ref 80.0–100.0)
Monocytes Absolute: 0.7 10*3/uL (ref 0.1–1.0)
Monocytes Relative: 12 %
Neutro Abs: 3.3 10*3/uL (ref 1.7–7.7)
Neutrophils Relative %: 61 %
Platelets: 275 10*3/uL (ref 150–400)
RBC: 4.81 MIL/uL (ref 4.22–5.81)
RDW: 13.4 % (ref 11.5–15.5)
WBC: 5.5 10*3/uL (ref 4.0–10.5)
nRBC: 0 % (ref 0.0–0.2)

## 2020-05-30 LAB — MAGNESIUM: Magnesium: 2.1 mg/dL (ref 1.7–2.4)

## 2020-05-30 LAB — FIBRINOGEN: Fibrinogen: 403 mg/dL (ref 210–475)

## 2020-05-30 LAB — HEMOGLOBIN A1C
Hgb A1c MFr Bld: 5.6 % (ref 4.8–5.6)
Mean Plasma Glucose: 114.02 mg/dL

## 2020-05-30 LAB — C-REACTIVE PROTEIN: CRP: 0.5 mg/dL (ref <1.0)

## 2020-05-30 LAB — FERRITIN: Ferritin: 105 ng/mL (ref 24–336)

## 2020-05-30 LAB — LACTATE DEHYDROGENASE: LDH: 120 U/L (ref 98–192)

## 2020-05-30 LAB — SEDIMENTATION RATE: Sed Rate: 12 mm/hr (ref 0–16)

## 2020-05-30 LAB — HIV ANTIBODY (ROUTINE TESTING W REFLEX): HIV Screen 4th Generation wRfx: NONREACTIVE

## 2020-05-30 MED ORDER — ATORVASTATIN CALCIUM 80 MG PO TABS: 80.0000 mg | ORAL_TABLET | Freq: Every day | ORAL | 2 refills | Status: DC

## 2020-05-30 MED ORDER — ASPIRIN 81 MG PO TBEC: 81.0000 mg | DELAYED_RELEASE_TABLET | Freq: Every day | ORAL | 11 refills | Status: AC

## 2020-05-30 MED ORDER — CLOPIDOGREL BISULFATE 75 MG PO TABS: 75.0000 mg | ORAL_TABLET | Freq: Every day | ORAL | Status: DC

## 2020-05-30 NOTE — Discharge Summary (Signed)
Physician Discharge Summary  Manuel Peters ZOX:[id number] DOB: 1960/10/23 DOA: 05/28/2020  PCP: Cheron Schaumann., MD  Admit date: 05/28/2020 Discharge date: 05/30/2020  Admitted From: Home Disposition: Home  Recommendations for Outpatient Follow-up:  1. Follow up with PCP in 1-2 weeks 2. Please obtain BMP/CBC in one week 3. Please follow up on the following pending results:  Home Health: Outpatient PT Equipment/Devices: RW  Discharge Condition: Stable CODE STATUS: Full Diet recommendation: Low-salt low-fat diet  Brief/Interim Summary: Manuel Babers Nunezis a 60 y.o.malewith medical history significant forhypertension and OSA, now presenting to the emergency department for evaluation of acute onset dizziness, nausea, and vomiting.Patient reports that he was in his usual state of health at work today when he developed acute onset of dizziness, felt as though the room was spinning, and this was associated with nausea and vomiting. He denies any recent fevers, chills, chest pain, palpitations, headache, change in vision or hearing, or focal numbness or weakness. Upon arrival to the ED, patient is found to be afebrile, saturating well on room air, and with stable blood pressure. EKG features sinus rhythm. Chemistry panel unremarkable and CBC with mild leukocytosis. CTA head was a normal study. Patient was sent from [facility] to [facility] for MRI brain which is concerning for subtle 8 mm ill-defined diffusion signal abnormality involving the central midbrain suspicious for acute ischemia. Patient was treated with Zofran and meclizine in the ED. COVID-19 screening test not yet resulted. Neurology was consulted by the ED physician and recommends medical admission.  Patient admitted as above with acute onset dizziness nausea and vomiting noted to have acute stroke on imaging, neurology following appreciate insight recommendations.  Given patient's symptoms and imaging recommend dual  antiplatelet therapy with aspirin and Plavix for 3 weeks and transition to aspirin alone as well as additional statin.  Patient to follow-up with outpatient neurology where he is already established in the next 2 to 3 weeks for ongoing follow-up.  Patient otherwise stable and agreeable and ready for discharge.  Patient was incidentally noted to be Covid positive at intake, he has no acute symptoms, fevers chills or hypoxia.  No current indication for treatment, we discussed that if he were to have symptoms including worsening shortness of breath fevers myalgias that he should report back to his PCP or the ED.  Discharge Diagnoses:  Principal Problem:   Acute ischemic stroke [facility]) Active Problems:   Hypertension   Sleep apnea    Discharge Instructions  Discharge Instructions    Ambulatory referral to Occupational Therapy   Complete by: As directed    Ambulatory referral to Occupational Therapy   Complete by: As directed    Ambulatory referral to Physical Therapy   Complete by: As directed    Call MD for:  persistant dizziness or light-headedness   Complete by: As directed    Diet - low sodium heart healthy   Complete by: As directed    Increase activity slowly   Complete by: As directed      Allergies as of 05/30/2020   No Known Allergies     Medication List    STOP taking these medications   amoxicillin-clavulanate 500-125 MG tablet Commonly known as: Augmentin   cyclobenzaprine 10 MG tablet Commonly known as: FLEXERIL   HYDROcodone-acetaminophen 5-325 MG tablet Commonly known as: Norco   ibuprofen 600 MG tablet Commonly known as: ADVIL     TAKE these medications   amLODipine 10 MG tablet Commonly known as: NORVASC Take 10  mg by mouth daily.   aspirin 81 MG EC tablet Take 1 tablet (81 mg total) by mouth daily. Swallow whole. Start taking on: May 31, 2020   atorvastatin 80 MG tablet Commonly known as: LIPITOR Take 1 tablet (80 mg total) by mouth  daily. Start taking on: May 31, 2020   clopidogrel 75 MG tablet Commonly known as: PLAVIX Take 1 tablet (75 mg total) by mouth daily. Start taking on: May 31, 2020   lisinopril 20 MG tablet Commonly known as: ZESTRIL Take 20 mg by mouth daily.   meclizine 25 MG tablet Commonly known as: ANTIVERT Take 1 tablet (25 mg total) by mouth 3 (three) times daily as needed for dizziness.            Durable Medical Equipment  (From admission, onward)         Start     Ordered   05/30/20 1352  For home use only DME Walker rolling  Once       Question Answer Comment  Walker: With 5 Inch Wheels   Patient needs a walker to treat with the following condition Stroke [facility])      05/30/20 1352          Follow-up Information    Schedule an appointment as soon as possible for a visit  with Cheron Schaumann., MD.   Specialty: Internal Medicine Why: For recheck of your symptoms Contact information: [address] [place] [address] [phone]        [facility] Follow [facility].   Specialty: Rehabilitation Why: The outpatient therapy will contact you for the first appointment Contact information: [address] [id number] mc [place] [address] [phone]             No Known Allergies  Consultations:  Neurology   Procedures/Studies: CT Angio Head W/Cm &/Or Wo Cm  Result Date: 05/28/2020 CLINICAL DATA:  Initial evaluation for acute vertigo, nausea, vomiting. EXAM: CT ANGIOGRAPHY HEAD TECHNIQUE: Multidetector CT imaging of the head was performed using the standard protocol during bolus administration of intravenous contrast. Multiplanar CT image reconstructions and MIPs were obtained to evaluate the vascular anatomy. CONTRAST:  75mL OMNIPAQUE IOHEXOL 350 MG/ML SOLN COMPARISON:  Prior study from 01/29/2005. FINDINGS: CT HEAD Brain: Cerebral volume within normal limits. No acute intracranial  hemorrhage. No acute large vessel territory infarct. No mass lesion, midline shift or mass effect. No hydrocephalus or extra-axial fluid collection. Vascular: No hyperdense vessel. Scattered vascular calcifications noted within the carotid siphons. Skull: Scalp soft tissues and calvarium within normal limits. Sinuses: Moderate mucosal thickening noted within the visualized sphenoethmoidal sinuses as well as the left frontal sinus, likely allergic/inflammatory nature. Mastoid air cells are clear. Orbits: Globes and orbital soft tissues demonstrate no acute finding. CTA HEAD Anterior circulation: Visualized distal cervical segments of the internal carotid arteries are widely patent bilaterally. Petrous segments widely patent. Minimal for age atheromatous plaque within the carotid siphons without stenosis. A1 segments patent bilaterally. Normal anterior communicating artery complex. Anterior cerebral arteries patent to their distal aspects without stenosis. No M1 stenosis or occlusion. Normal MCA bifurcations. Distal MCA branches well perfused and symmetric. Posterior circulation: Both V4 segments patent to the vertebrobasilar junction without stenosis. Right PICA origin patent and normal. Left PICA origin not visualized. Basilar patent to its distal aspect without stenosis. Superior cerebellar arteries patent bilaterally. Both PCA supplied via the basilar as well as prominent bilateral posterior communicating arteries. Both PCAs well perfused to their  distal aspects. Venous sinuses: Patent. Anatomic variants: None significant.  No intracranial aneurysm. IMPRESSION: CT HEAD IMPRESSION: 1. Normal head CT.  No acute intracranial abnormality identified. 2. Moderate paranasal sinus disease, likely allergic/inflammatory in nature. CTA HEAD IMPRESSION: Normal CTA of the head. No large vessel occlusion, hemodynamically significant stenosis, or other acute vascular abnormality. No intracranial aneurysm. Electronically Signed    By: Rise Mu M.D.   On: 05/28/2020 23:37   MR BRAIN WO CONTRAST  Result Date: 05/29/2020 CLINICAL DATA:  Initial evaluation for acute dizziness, ataxia. EXAM: MRI HEAD WITHOUT CONTRAST TECHNIQUE: Multiplanar, multiecho pulse sequences of the brain and surrounding structures were obtained without intravenous contrast. COMPARISON:  Prior CTA from 05/28/2020 FINDINGS: Brain: Cerebral volume within normal limits for age. No significant cerebral white matter disease. There is subtle somewhat ill-defined diffusion signal abnormality involving the central aspect of the midbrain (series 5, image 66). Area of involvement measures up to approximately 8 mm. Question of associated signal loss on ADC map (series 6, image 18). Given the patient's history, finding is suspicious for possible subtle acute small vessel ischemia. No associated hemorrhage or mass effect. No other diffusion abnormality to suggest acute or subacute ischemia. Gray-white matter differentiation otherwise maintained. No encephalomalacia to suggest chronic cortical infarction elsewhere within the brain. No other evidence for acute or chronic intracranial hemorrhage. No mass lesion, midline shift or mass effect. No hydrocephalus or extra-axial fluid collection. Pituitary gland suprasellar region within normal limits. Midline structures intact. Vascular: Major intracranial vascular flow voids are maintained. Skull and upper cervical spine: Craniocervical junction within normal limits. Bone marrow signal intensity normal. No scalp soft tissue abnormality. Sinuses/Orbits: Globes and orbital soft tissues within normal limits. Moderate mucosal thickening noted throughout the paranasal sinuses, chronic in appearance. No mastoid effusion. Inner ear structures grossly normal. Other: None. IMPRESSION: 1. Subtle 8 mm ill-defined diffusion signal abnormality involving the central aspect of the midbrain, suspicious for possible acute small vessel ischemia  given the patient's history. No associated hemorrhage or mass effect. 2. Otherwise normal brain MRI for age. 3. Moderate chronic paranasal sinus disease. Electronically Signed   By: Rise Mu M.D.   On: 05/29/2020 02:37   DG CHEST PORT 1 VIEW  Result Date: 05/29/2020 CLINICAL DATA:  COVID-19 positive.  Nausea and vomiting EXAM: PORTABLE CHEST 1 VIEW COMPARISON:  None. FINDINGS: Lungs are clear. Heart is upper normal in size with pulmonary vascularity normal. No adenopathy. There is aortic atherosclerosis. No bone lesions. IMPRESSION: No edema or airspace opacity. Heart upper normal in size. Aortic Atherosclerosis (ICD10-I70.0). Electronically Signed   By: Bretta Bang III M.D.   On: 05/29/2020 11:16   ECHOCARDIOGRAM COMPLETE  Result Date: 05/29/2020    ECHOCARDIOGRAM REPORT   Patient Name:   KASE SHUGHART Date of Exam: 05/29/2020 Medical Rec #:  [id number]         Height:       66.0 in Accession #:    [id number]        Weight:       170.0 lb Date of Birth:  01/08/1961         BSA:          1.866 m Patient Age:    59 years          BP:           116/69 mmHg Patient Gender: M                 HR:  73 bpm. Exam Location:  Inpatient Procedure: 2D Echo, Cardiac Doppler and Color Doppler Indications:    Stroke 434.91 / I163.9  History:        Patient has no prior history of Echocardiogram examinations.                 Risk Factors:Hypertension and Sleep Apnea. COVID-19 Positive.  Sonographer:    Elmarie Shiley Dance Referring Phys: [id number] TIMOTHY S OPYD IMPRESSIONS  1. Left ventricular ejection fraction, by estimation, is 60 to 65%. The left ventricle has normal function. The left ventricle has no regional wall motion abnormalities. There is mild left ventricular hypertrophy. Left ventricular diastolic parameters are consistent with Grade I diastolic dysfunction (impaired relaxation).  2. Right ventricular systolic function is normal. The right ventricular size is normal. Tricuspid  regurgitation signal is inadequate for assessing PA pressure.  3. The mitral valve is normal in structure. No evidence of mitral valve regurgitation. No evidence of mitral stenosis.  4. The aortic valve is tricuspid. Aortic valve regurgitation is mild. Mild aortic valve sclerosis is present, with no evidence of aortic valve stenosis.  5. Aortic dilatation noted. There is mild dilatation of the aortic root, measuring 40 mm.  6. The inferior vena cava is dilated in size with >50% respiratory variability, suggesting right atrial pressure of 8 mmHg. FINDINGS  Left Ventricle: Left ventricular ejection fraction, by estimation, is 60 to 65%. The left ventricle has normal function. The left ventricle has no regional wall motion abnormalities. The left ventricular internal cavity size was normal in size. There is  mild left ventricular hypertrophy. Left ventricular diastolic parameters are consistent with Grade I diastolic dysfunction (impaired relaxation). Right Ventricle: The right ventricular size is normal. No increase in right ventricular wall thickness. Right ventricular systolic function is normal. Tricuspid regurgitation signal is inadequate for assessing PA pressure. Left Atrium: Left atrial size was normal in size. Right Atrium: Right atrial size was normal in size. Pericardium: There is no evidence of pericardial effusion. Mitral Valve: The mitral valve is normal in structure. There is mild calcification of the mitral valve leaflet(s). Mild mitral annular calcification. No evidence of mitral valve regurgitation. No evidence of mitral valve stenosis. Tricuspid Valve: The tricuspid valve is normal in structure. Tricuspid valve regurgitation is not demonstrated. Aortic Valve: The aortic valve is tricuspid. Aortic valve regurgitation is mild. Mild aortic valve sclerosis is present, with no evidence of aortic valve stenosis. Pulmonic Valve: The pulmonic valve was normal in structure. Pulmonic valve regurgitation is  trivial. Aorta: Aortic dilatation noted. There is mild dilatation of the aortic root, measuring 40 mm. Venous: The inferior vena cava is dilated in size with greater than 50% respiratory variability, suggesting right atrial pressure of 8 mmHg. IAS/Shunts: No atrial level shunt detected by color flow Doppler.  LEFT VENTRICLE PLAX 2D LVIDd:         4.70 cm  Diastology LVIDs:         3.00 cm  LV e' medial:    8.38 cm/s LV PW:         1.00 cm  LV E/e' medial:  10.0 LV IVS:        1.00 cm  LV e' lateral:   12.20 cm/s LVOT diam:     2.10 cm  LV E/e' lateral: 6.9 LV SV:         72 LV SV Index:   39 LVOT Area:     3.46 cm  RIGHT VENTRICLE  IVC RV Basal diam:  2.50 cm     IVC diam: 2.10 cm RV S prime:     15.10 cm/s TAPSE (M-mode): 2.1 cm LEFT ATRIUM             Index       RIGHT ATRIUM           Index LA diam:        3.50 cm 1.88 cm/m  RA Area:     12.30 cm LA Vol (A2C):   55.5 ml 29.74 ml/m RA Volume:   25.20 ml  13.50 ml/m LA Vol (A4C):   31.5 ml 16.88 ml/m LA Biplane Vol: 43.4 ml 23.25 ml/m  AORTIC VALVE LVOT Vmax:   101.00 cm/s LVOT Vmean:  67.000 cm/s LVOT VTI:    0.209 m  AORTA Ao Root diam: 4.00 cm Ao Asc diam:  3.60 cm MITRAL VALVE MV Area (PHT): 3.85 cm    SHUNTS MV Decel Time: 197 msec    Systemic VTI:  0.21 m MV E velocity: 83.60 cm/s  Systemic Diam: 2.10 cm MV A velocity: 91.70 cm/s MV E/A ratio:  0.91 Marca Ancona MD Electronically signed by Marca Ancona MD Signature Date/Time: 05/29/2020/11:00:01 PM    Final    VAS US CAROTID (at [facility] and [facility] only)  Result Date: 05/29/2020 Carotid Arterial Duplex Study Indications:       Dizziness, Covid-19. Risk Factors:      Hypertension. Other Factors:     OSA. Comparison Study:  No prior study Performing Technologist: Sherren Kerns RVS  Examination Guidelines: A complete evaluation includes B-mode imaging, spectral Doppler, color Doppler, and power Doppler as needed of all accessible portions of each vessel. Bilateral testing is considered an integral  part of a complete examination. Limited examinations for reoccurring indications may be performed as noted.  Right Carotid Findings: +----------+--------+--------+--------+------------------+------------------+           PSV cm/sEDV cm/sStenosisPlaque DescriptionComments           +----------+--------+--------+--------+------------------+------------------+ CCA Prox  69      10                                intimal thickening +----------+--------+--------+--------+------------------+------------------+ CCA Distal66      16                                intimal thickening +----------+--------+--------+--------+------------------+------------------+ ICA Prox  64      25                                tortuous           +----------+--------+--------+--------+------------------+------------------+ ICA Distal                                          tortuous           +----------+--------+--------+--------+------------------+------------------+ ECA       97      19                                tortuous           +----------+--------+--------+--------+------------------+------------------+ +----------+--------+-------+--------+-------------------+  PSV cm/sEDV cmsDescribeArm Pressure (mmHG) +----------+--------+-------+--------+-------------------+ Subclavian110                                        +----------+--------+-------+--------+-------------------+ +---------+--------+--+--------+--+ VertebralPSV cm/s41EDV cm/s16 +---------+--------+--+--------+--+  Left Carotid Findings: +----------+--------+--------+--------+------------------+--------+           PSV cm/sEDV cm/sStenosisPlaque DescriptionComments +----------+--------+--------+--------+------------------+--------+ CCA Prox  100     18                                         +----------+--------+--------+--------+------------------+--------+ CCA Distal83      14                                          +----------+--------+--------+--------+------------------+--------+ ICA Prox  71      20              heterogenous      tortuous +----------+--------+--------+--------+------------------+--------+ ICA Distal43      13                                tortuous +----------+--------+--------+--------+------------------+--------+ ECA       129     16                                         +----------+--------+--------+--------+------------------+--------+ +----------+--------+--------+--------+-------------------+           PSV cm/sEDV cm/sDescribeArm Pressure (mmHG) +----------+--------+--------+--------+-------------------+ [id number]                                         +----------+--------+--------+--------+-------------------+ +---------+--------+--+--------+-+ VertebralPSV cm/s36EDV cm/s9 +---------+--------+--+--------+-+   Summary: Right Carotid: The extracranial vessels were near-normal with only minimal wall                thickening or plaque. Left Carotid: The extracranial vessels were near-normal with only minimal wall               thickening or plaque. Vertebrals:  Bilateral vertebral arteries demonstrate antegrade flow. Subclavians: Normal flow hemodynamics were seen in bilateral subclavian              arteries. *See table(s) above for measurements and observations.     Preliminary      Subjective: No acute issues or events overnight, dizziness markedly improving with PT evaluation, otherwise requesting discharge home which is certainly reasonable given improvement in symptoms.   Discharge Exam: Vitals:   05/30/20 0949 05/30/20 1339  BP: (!) 160/86 (!) 151/90  Pulse: 63 69  Resp: 18 18  Temp: 98.3 F (36.8 C) 98 F (36.7 C)  SpO2: 98% 97%   Vitals:   05/30/20 0041 05/30/20 0406 05/30/20 0949 05/30/20 1339  BP: (!) 128/91 (!) 149/92 (!) 160/86 (!) 151/90  Pulse: 65 60 63 69  Resp: 18 16 18 18   Temp: 98.3 F  (36.8 C) 98.1 F (36.7 C) 98.3 F (36.8 C) 98 F (36.7 C)  TempSrc: Oral Oral Oral Oral  SpO2: 97% 95% 98% 97%  Weight:      Height:        General: Pt is alert, awake, not in acute distress Cardiovascular: RRR, S1/S2 +, no rubs, no gallops Respiratory: CTA bilaterally, no wheezing, no rhonchi Abdominal: Soft, NT, ND, bowel sounds + Extremities: no edema, no cyanosis    The results of significant diagnostics from this hospitalization (including imaging, microbiology, ancillary and laboratory) are listed below for reference.     Microbiology: Recent Results (from the past 240 hour(s))  SARS CORONAVIRUS 2 (TAT 6-24 HRS) Nasopharyngeal Nasopharyngeal Swab     Status: Abnormal   Collection Time: 05/29/20 12:10 AM   Specimen: Nasopharyngeal Swab  Result Value Ref Range Status   SARS Coronavirus 2 POSITIVE (A) NEGATIVE Final    Comment: (NOTE) SARS-CoV-2 target nucleic acids are DETECTED.  The SARS-CoV-2 RNA is generally detectable in upper and lower respiratory specimens during the acute phase of infection. Positive results are indicative of the presence of SARS-CoV-2 RNA. Clinical correlation with patient history and other diagnostic information is  necessary to determine patient infection status. Positive results do not rule out bacterial infection or co-infection with other viruses.  The expected result is Negative.  Fact Sheet for Patients: HairSlick.no  Fact Sheet for Healthcare Providers: quierodirigir.com  This test is not yet approved or cleared by the [place] FDA and  has been authorized for detection and/or diagnosis of SARS-CoV-2 by FDA under an Emergency Use Authorization (EUA). This EUA will remain  in effect (meaning this test can be used) for the duration of the COVID-19 declaration under Section 564(b)(1) of the Act, 21 U. S.C. section 360bbb-3(b)(1), unless the authorization is terminated  or revoked sooner.   Performed at [facility], [address] [place], [place] [address]      Labs: BNP (last 3 results) No results for input(s): BNP in the last 8760 hours. Basic Metabolic Panel: Recent Labs  Lab 05/28/20 1545 05/29/20 1050 05/30/20 0653  NA 137 139 138  K 4.3 3.7 3.8  CL 100 103 104  CO2 26 27 25   GLUCOSE 125* 98 90  BUN 22 14 13   CREATININE 1.05 1.01 0.92  CALCIUM 8.9 9.0 8.5*  MG  --  2.3 2.1  PHOS  --  3.0 2.6   Liver Function Tests: Recent Labs  Lab 05/28/20 1545 05/29/20 1050 05/30/20 0653  AST 32 24 21  ALT 27 25 25   ALKPHOS 56 52 46  BILITOT 1.0 1.5* 1.6*  PROT 7.7 7.4 6.2*  ALBUMIN 4.4 3.6 3.2*   Recent Labs  Lab 05/28/20 1545  LIPASE 35   No results for input(s): AMMONIA in the last 168 hours. CBC: Recent Labs  Lab 05/28/20 1545 05/29/20 1050 05/30/20 0653  WBC 11.7* 8.0 5.5  NEUTROABS  --  5.8 3.3  HGB 14.9 14.0 13.6  HCT 45.0 43.3 42.4  MCV 87.4 87.7 88.1  PLT 305 313 275   Cardiac Enzymes: No results for input(s): CKTOTAL, CKMB, CKMBINDEX, TROPONINI in the last 168 hours. BNP: Invalid input(s): POCBNP CBG: No results for input(s): GLUCAP in the last 168 hours. D-Dimer Recent Labs    05/29/20 2101  DDIMER 1.19*   Hgb A1c Recent Labs    05/30/20 0653  HGBA1C 5.6   Lipid Profile Recent Labs    05/30/20 0653  CHOL 191  HDL 39*  LDLCALC 136*  TRIG 82  CHOLHDL 4.9   Thyroid function studies No results for input(s): TSH, T4TOTAL, T3FREE, THYROIDAB in the  last 72 hours.  Invalid input(s): FREET3 Anemia work up [employer]    05/29/20 2101 05/30/20 0653  FERRITIN 99 105   Urinalysis No results found for: COLORURINE, APPEARANCEUR, LABSPEC, PHURINE, GLUCOSEU, HGBUR, BILIRUBINUR, KETONESUR, PROTEINUR, UROBILINOGEN, NITRITE, LEUKOCYTESUR Sepsis Labs Invalid input(s): PROCALCITONIN,  WBC,  LACTICIDVEN Microbiology Recent Results (from the past 240 hour(s))  SARS CORONAVIRUS 2 (TAT 6-24  HRS) Nasopharyngeal Nasopharyngeal Swab     Status: Abnormal   Collection Time: 05/29/20 12:10 AM   Specimen: Nasopharyngeal Swab  Result Value Ref Range Status   SARS Coronavirus 2 POSITIVE (A) NEGATIVE Final    Comment: (NOTE) SARS-CoV-2 target nucleic acids are DETECTED.  The SARS-CoV-2 RNA is generally detectable in upper and lower respiratory specimens during the acute phase of infection. Positive results are indicative of the presence of SARS-CoV-2 RNA. Clinical correlation with patient history and other diagnostic information is  necessary to determine patient infection status. Positive results do not rule out bacterial infection or co-infection with other viruses.  The expected result is Negative.  Fact Sheet for Patients: HairSlick.nohttps://www.fda.gov/media/138098/download  Fact Sheet for Healthcare Providers: quierodirigir.comhttps://www.fda.gov/media/138095/download  This test is not yet approved or cleared by the [place] FDA and  has been authorized for detection and/or diagnosis of SARS-CoV-2 by FDA under an Emergency Use Authorization (EUA). This EUA will remain  in effect (meaning this test can be used) for the duration of the COVID-19 declaration under Section 564(b)(1) of the Act, 21 U. S.C. section 360bbb-3(b)(1), unless the authorization is terminated or revoked sooner.   Performed at [facility], [address] [place], [place] [address]      Time coordinating discharge: Over 30 minutes  SIGNED:   Azucena FallenWilliam C Rita Vialpando, DO [facility] 05/30/2020, 3:23 PM Pager   If 7PM-7AM, please contact night-coverage [email or web address]

## 2020-05-30 NOTE — Progress Notes (Signed)
Physical Therapy Treatment Patient Details Name: Manuel Peters MRN: [id number] DOB: [birth year] Today's Date: 05/30/2020    History of Present Illness 60 y.o. male with medical history significant for hypertension and OSA, now presenting to the emergency department for evaluation of acute onset dizziness, nausea, and vomiting. MRI + 19mm diffusion abnormality involving central area of the midbrain; vaccinated; Covid +.    PT Comments    Pt tolerates treatment well. Pt demonstrates nystagmus with pursuits to R side, reporting dizziness. Pt consistently reports dizziness with eye movement to R side and not to left. Dix-hallpike test to R side was negative. Pt remains unsteady with ambulation and requires UE support of RW to improve balance and reduce falls risk. Pt will continue to benefit from acute PT POC to improve balance and gait quality in an effort to restore independence. PT recommends outpatient neuro PT and a RW at the time of discharge.   Follow Up Recommendations  Outpatient PT;Supervision for mobility/OOB (neuro outpatient)     Equipment Recommendations  Rolling walker with 5" wheels    Recommendations for Other Services       Precautions / Restrictions Precautions Precautions: Fall Restrictions Weight Bearing Restrictions: No    Mobility  Bed Mobility Overal bed mobility: Modified Independent Bed Mobility: Sit to Supine;Supine to Sit (supine to long sitting)     Supine to sit: Modified independent (Device/Increase time) Sit to supine: Modified independent (Device/Increase time)      Transfers Overall transfer level: Needs assistance Equipment used: None Transfers: Sit to/from Stand Sit to Stand: Supervision            Ambulation/Gait Ambulation/Gait assistance: Min guard;Min assist (minA without RW, minG with RW) Gait Distance (Feet): 80 Feet Assistive device: Rolling walker (2 wheeled);None Gait Pattern/deviations: Step-through pattern Gait  velocity: reduced Gait velocity interpretation: <1.8 ft/sec, indicate of risk for recurrent falls General Gait Details: pt with slowed step through gait, staggers to left side multiple times when ambulating without UE support. Improved stability with use of RW, no noted LOB   Stairs             Wheelchair Mobility    Modified Rankin (Stroke Patients Only) Modified Rankin (Stroke Patients Only) Pre-Morbid Rankin Score: No symptoms Modified Rankin: Moderately severe disability     Balance Overall balance assessment: Needs assistance Sitting-balance support: No upper extremity supported;Feet supported Sitting balance-Leahy Scale: Good     Standing balance support: Bilateral upper extremity supported Standing balance-Leahy Scale: Poor Standing balance comment: reliant on UE support or minG assist                            Cognition Arousal/Alertness: Awake/alert Behavior During Therapy: WFL for tasks assessed/performed Overall Cognitive Status: Within Functional Limits for tasks assessed                                        Exercises Other Exercises Other Exercises: VOR horizontal x 10 Other Exercises: saccadic eye movements x10 between 2 objects in room    General Comments General comments (skin integrity, edema, etc.): VSS on RA. PT performs vestibular assessment: pt with nystagmus and dizziness with pursuits to R side. Pt reports no dizziness with VOR. Pt reports dizziness with head thurst to L side (R eye movement). Pt denies symptoms with dix-hallpike of R side.  Pertinent Vitals/Pain Pain Assessment: No/denies pain    Home Living                      Prior Function            PT Goals (current goals can now be found in the care plan section) Acute Rehab PT Goals Patient Stated Goal: to get better Progress towards PT goals: Progressing toward goals    Frequency    Min 4X/week      PT Plan Current plan  remains appropriate    Co-evaluation              AM-PAC PT "6 Clicks" Mobility   Outcome Measure  Help needed turning from your back to your side while in a flat bed without using bedrails?: None Help needed moving from lying on your back to sitting on the side of a flat bed without using bedrails?: None Help needed moving to and from a bed to a chair (including a wheelchair)?: A Little Help needed standing up from a chair using your arms (e.g., wheelchair or bedside chair)?: A Little Help needed to walk in hospital room?: A Little Help needed climbing 3-5 steps with a railing? : A Little 6 Click Score: 20    End of Session   Activity Tolerance: Patient tolerated treatment well Patient left: in bed;with call bell/phone within reach;with bed alarm set Nurse Communication: Mobility status PT Visit Diagnosis: Other abnormalities of gait and mobility (R26.89);Dizziness and giddiness (R42);Other symptoms and signs involving the nervous system (A12.878)     Time: 6767-2094 PT Time Calculation (min) (ACUTE ONLY): 18 min  Charges:  $Gait Training: 8-22 mins                     Arlyss Gandy, PT, DPT Acute Rehabilitation Pager: [phone]    Arlyss Gandy 05/30/2020, 12:46 PM

## 2020-05-30 NOTE — TOC Transition Note (Signed)
Transition of Care [facility]) - CM/SW Discharge Note   Patient Details  Name: Manuel Peters MRN: [id number] Date of Birth: 1961-03-20  [facility] of [facility]) CM/SW Contact:  Kermit Balo, RN Phone Number: 05/30/2020, 2:04 PM   Clinical Narrative:    Pt discharging home with outpatient therapy. CM called into the room and he is in agreement with attending [facility]. Orders in Epic and information on the AVS. Walker to be delivered to the room per [employer].  Pt has supervision at home and transportation to home.   Final next level of care: OP Rehab Barriers to Discharge: No Barriers Identified   Patient Goals and CMS Choice     Choice offered to / list presented to : Patient  Discharge Placement                       Discharge Plan and Services                DME Arranged: Walker rolling DME Agency: [employer] Date DME Agency Contacted: 05/30/20   Representative spoke with at DME Agency: Velna Hatchet            Social Determinants of Health (SDOH) Interventions     Readmission Risk Interventions No flowsheet data found.

## 2020-05-30 NOTE — Plan of Care (Signed)
Discussed warning signs of a stroke, d/c instructions and medication administration.  Patient verbalized an understanding.  We are awaiting the delivery of a walker and then will contact his wife to come pick him up.

## 2020-05-30 NOTE — Progress Notes (Addendum)
STROKE TEAM PROGRESS NOTE   INTERVAL HISTORY No acute events in past 24 hours  [name] states he has had a good night and is doing okay this morning. He reports that dizziness is still present as a room spinning sensation but continues to improve. No further N/V. He rested well. He denies new complaints or concerns. Denies headache, new vision changes, new numbness or weakness.    Vitals:   05/29/20 1961 05/29/20 2043 05/30/20 0041 05/30/20 0406  BP:  140/82 (!) 128/91 (!) 149/92  Pulse:  71 65 60  Resp:  18 18 16   Temp: 97.8 F (36.6 C) 98.2 F (36.8 C) 98.3 F (36.8 C) 98.1 F (36.7 C)  TempSrc: Oral Oral Oral Oral  SpO2:  100% 97% 95%  Weight:  82.9 kg    Height:  5\' 7"  (1.702 m)     CBC:  Recent Labs  Lab 05/29/20 1050 05/30/20 0653  WBC 8.0 5.5  NEUTROABS 5.8 3.3  HGB 14.0 13.6  HCT 43.3 42.4  MCV 87.7 88.1  PLT 313 275   Basic Metabolic Panel:  Recent Labs  Lab 05/29/20 1050 05/30/20 0653  NA 139 138  K 3.7 3.8  CL 103 104  CO2 27 25  GLUCOSE 98 90  BUN 14 13  CREATININE 1.01 0.92  CALCIUM 9.0 8.5*  MG 2.3 2.1  PHOS 3.0 2.6   Lipid Panel:  Recent Labs  Lab 05/30/20 0653  CHOL 191  TRIG 82  HDL 39*  CHOLHDL 4.9  VLDL 16  LDLCALC 161136*   HgbA1c:  Recent Labs  Lab 05/30/20 0653  HGBA1C 5.6   Urine Drug Screen:  Recent Labs  Lab 05/30/20 0420  LABOPIA NONE DETECTED  COCAINSCRNUR NONE DETECTED  LABBENZ NONE DETECTED  AMPHETMU NONE DETECTED  THCU NONE DETECTED  LABBARB NONE DETECTED    Alcohol Level No results for input(s): ETH in the last 168 hours.  IMAGING past 24 hours DG CHEST PORT 1 VIEW  Result Date: 05/29/2020 CLINICAL DATA:  COVID-19 positive.  Nausea and vomiting EXAM: PORTABLE CHEST 1 VIEW COMPARISON:  None. FINDINGS: Lungs are clear. Heart is upper normal in size with pulmonary vascularity normal. No adenopathy. There is aortic atherosclerosis. No bone lesions. IMPRESSION: No edema or airspace opacity. Heart upper normal  in size. Aortic Atherosclerosis (ICD10-I70.0). Electronically Signed   By: Bretta BangWilliam  Woodruff III M.D.   On: 05/29/2020 11:16   ECHOCARDIOGRAM COMPLETE  Result Date: 05/29/2020    ECHOCARDIOGRAM REPORT   Patient Name:   Manuel RiggsJULIO CESAR Peters Date of Exam: 05/29/2020 Medical Rec #:  [id number]         Height:       66.0 in Accession #:    [id number]        Weight:       170.0 lb Date of Birth:  10/17/1960         BSA:          1.866 m Patient Age:    60 years          BP:           116/69 mmHg Patient Gender: M                 HR:           73 bpm. Exam Location:  Inpatient Procedure: 2D Echo, Cardiac Doppler and Color Doppler Indications:    Stroke 434.91 / I163.9  History:  Patient has no prior history of Echocardiogram examinations.                 Risk Factors:Hypertension and Sleep Apnea. COVID-19 Positive.  Sonographer:    Elmarie Shiley Dance Referring Phys: [id number] TIMOTHY S OPYD IMPRESSIONS  1. Left ventricular ejection fraction, by estimation, is 60 to 65%. The left ventricle has normal function. The left ventricle has no regional wall motion abnormalities. There is mild left ventricular hypertrophy. Left ventricular diastolic parameters are consistent with Grade I diastolic dysfunction (impaired relaxation).  2. Right ventricular systolic function is normal. The right ventricular size is normal. Tricuspid regurgitation signal is inadequate for assessing PA pressure.  3. The mitral valve is normal in structure. No evidence of mitral valve regurgitation. No evidence of mitral stenosis.  4. The aortic valve is tricuspid. Aortic valve regurgitation is mild. Mild aortic valve sclerosis is present, with no evidence of aortic valve stenosis.  5. Aortic dilatation noted. There is mild dilatation of the aortic root, measuring 40 mm.  6. The inferior vena cava is dilated in size with >50% respiratory variability, suggesting right atrial pressure of 8 mmHg. FINDINGS  Left Ventricle: Left ventricular ejection fraction, by  estimation, is 60 to 65%. The left ventricle has normal function. The left ventricle has no regional wall motion abnormalities. The left ventricular internal cavity size was normal in size. There is  mild left ventricular hypertrophy. Left ventricular diastolic parameters are consistent with Grade I diastolic dysfunction (impaired relaxation). Right Ventricle: The right ventricular size is normal. No increase in right ventricular wall thickness. Right ventricular systolic function is normal. Tricuspid regurgitation signal is inadequate for assessing PA pressure. Left Atrium: Left atrial size was normal in size. Right Atrium: Right atrial size was normal in size. Pericardium: There is no evidence of pericardial effusion. Mitral Valve: The mitral valve is normal in structure. There is mild calcification of the mitral valve leaflet(s). Mild mitral annular calcification. No evidence of mitral valve regurgitation. No evidence of mitral valve stenosis. Tricuspid Valve: The tricuspid valve is normal in structure. Tricuspid valve regurgitation is not demonstrated. Aortic Valve: The aortic valve is tricuspid. Aortic valve regurgitation is mild. Mild aortic valve sclerosis is present, with no evidence of aortic valve stenosis. Pulmonic Valve: The pulmonic valve was normal in structure. Pulmonic valve regurgitation is trivial. Aorta: Aortic dilatation noted. There is mild dilatation of the aortic root, measuring 40 mm. Venous: The inferior vena cava is dilated in size with greater than 50% respiratory variability, suggesting right atrial pressure of 8 mmHg. IAS/Shunts: No atrial level shunt detected by color flow Doppler.  LEFT VENTRICLE PLAX 2D LVIDd:         4.70 cm  Diastology LVIDs:         3.00 cm  LV e' medial:    8.38 cm/s LV PW:         1.00 cm  LV E/e' medial:  10.0 LV IVS:        1.00 cm  LV e' lateral:   12.20 cm/s LVOT diam:     2.10 cm  LV E/e' lateral: 6.9 LV SV:         72 LV SV Index:   39 LVOT Area:     3.46  cm  RIGHT VENTRICLE             IVC RV Basal diam:  2.50 cm     IVC diam: 2.10 cm RV S prime:     15.10 cm/s  TAPSE (M-mode): 2.1 cm LEFT ATRIUM             Index       RIGHT ATRIUM           Index LA diam:        3.50 cm 1.88 cm/m  RA Area:     12.30 cm LA Vol (A2C):   55.5 ml 29.74 ml/m RA Volume:   25.20 ml  13.50 ml/m LA Vol (A4C):   31.5 ml 16.88 ml/m LA Biplane Vol: 43.4 ml 23.25 ml/m  AORTIC VALVE LVOT Vmax:   101.00 cm/s LVOT Vmean:  67.000 cm/s LVOT VTI:    0.209 m  AORTA Ao Root diam: 4.00 cm Ao Asc diam:  3.60 cm MITRAL VALVE MV Area (PHT): 3.85 cm    SHUNTS MV Decel Time: 197 msec    Systemic VTI:  0.21 m MV E velocity: 83.60 cm/s  Systemic Diam: 2.10 cm MV A velocity: 91.70 cm/s MV E/A ratio:  0.91 Marca Ancona MD Electronically signed by Marca Ancona MD Signature Date/Time: 05/29/2020/11:00:01 PM    Final    VAS US CAROTID (at [facility] and [facility] only)  Result Date: 05/29/2020 Carotid Arterial Duplex Study Indications:       Dizziness, Covid-19. Risk Factors:      Hypertension. Other Factors:     OSA. Comparison Study:  No prior study Performing Technologist: Sherren Kerns RVS  Examination Guidelines: A complete evaluation includes B-mode imaging, spectral Doppler, color Doppler, and power Doppler as needed of all accessible portions of each vessel. Bilateral testing is considered an integral part of a complete examination. Limited examinations for reoccurring indications may be performed as noted.  Right Carotid Findings: +----------+--------+--------+--------+------------------+------------------+           PSV cm/sEDV cm/sStenosisPlaque DescriptionComments           +----------+--------+--------+--------+------------------+------------------+ CCA Prox  69      10                                intimal thickening +----------+--------+--------+--------+------------------+------------------+ CCA Distal66      16                                intimal thickening  +----------+--------+--------+--------+------------------+------------------+ ICA Prox  64      25                                tortuous           +----------+--------+--------+--------+------------------+------------------+ ICA Distal                                          tortuous           +----------+--------+--------+--------+------------------+------------------+ ECA       97      19                                tortuous           +----------+--------+--------+--------+------------------+------------------+ +----------+--------+-------+--------+-------------------+           PSV cm/sEDV cmsDescribeArm Pressure (mmHG) +----------+--------+-------+--------+-------------------+ Subclavian110                                        +----------+--------+-------+--------+-------------------+ +---------+--------+--+--------+--+  VertebralPSV cm/s41EDV cm/s16 +---------+--------+--+--------+--+  Left Carotid Findings: +----------+--------+--------+--------+------------------+--------+           PSV cm/sEDV cm/sStenosisPlaque DescriptionComments +----------+--------+--------+--------+------------------+--------+ CCA Prox  100     18                                         +----------+--------+--------+--------+------------------+--------+ CCA Distal83      14                                         +----------+--------+--------+--------+------------------+--------+ ICA Prox  71      20              heterogenous      tortuous +----------+--------+--------+--------+------------------+--------+ ICA Distal43      13                                tortuous +----------+--------+--------+--------+------------------+--------+ ECA       129     16                                         +----------+--------+--------+--------+------------------+--------+ +----------+--------+--------+--------+-------------------+           PSV cm/sEDV  cm/sDescribeArm Pressure (mmHG) +----------+--------+--------+--------+-------------------+ [id number]                                         +----------+--------+--------+--------+-------------------+ +---------+--------+--+--------+-+ VertebralPSV cm/s36EDV cm/s9 +---------+--------+--+--------+-+   Summary: Right Carotid: The extracranial vessels were near-normal with only minimal wall                thickening or plaque. Left Carotid: The extracranial vessels were near-normal with only minimal wall               thickening or plaque. Vertebrals:  Bilateral vertebral arteries demonstrate antegrade flow. Subclavians: Normal flow hemodynamics were seen in bilateral subclavian              arteries. *See table(s) above for measurements and observations.     Preliminary    PHYSICAL EXAM Physical Exam  HEENT-  [place]/AT    Resp: on room air without increased work of breathing  Neurological Examination Mental Status: Awake and alert. Oriented to person, place, time and situation. Speech fluent without evidence of aphasia.  Able to follow all commands without difficulty. Cranial Nerves: II: Visual fields grossly normal. PERRL.  III,IV, VI: No ptosis. EOM are full, but in this context there is prominent conjugate right-beating nystagmus in all directions of gaze.  V,VII: Grimace is symmetric, facial temp sensation equal bilaterally VIII: hearing intact to voice IX,X: No hypophonia XI: Symmetric shoulder shrug XII: Midline tongue extension Motor: Right :  Upper extremity   5/5                                      Left:     Upper extremity   5/5             Lower extremity   5/5  Lower extremity   5/5 No pronator drift Sensory: Temp and light touch intact throughout, bilaterally Cerebellar: No ataxia with FNF bilaterally  Gait: Deferred due to fall risk concerns.    ASSESSMENT/PLAN Manuel Peters is a 60 y.o. male with medical  history significant for hypertension and OSA on CPAP, and recently seen by rheumatology for positive ANA. He also has a significant family history of stroke with mother and one brother dying d/t stroke and remaining brother s/p CVA with significant paralysis.  He presented to the emergency department for evaluation of acute onset dizziness, nausea, and vomiting.  Patient reports that he was in his usual state of health at work when he developed acute onset of dizziness, felt as though the room was spinning, and this was associated with nausea and vomiting. He was found to have significant nystagmus and truncal lean on exam upon arrival. Also found to be COVID positive.   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   CTA head & neck: Normal head CT.  No acute intracranial abnormality identified.   MRI: Subtle 8 mm ill-defined diffusion signal abnormality involving the central aspect of the midbrain, suspicious for possible acute small vessel ischemia given the patient's history. No associated hemorrhage or mass effect.  Carotid Doppler unremarkable  2D Echo EF 60-65%  LDL  136  HgbA1c 5.6  UDS negative VTE prophylaxis is recommended.  Passed bedside swallow eval. On heart healthy diet.   Recommend aspirin and Plavix DAPT for 3 weeks and then aspirin alone.  Therapy recommendations:  CIR  Disposition:  CIR pending acceptance and insurance approval  COVID   Covid testing positive  CXR was without acute finding 1/27.   No fever, chills or coughing prior to admission.   WBC 11.7.   Management per primary team.   Hypertension  Home meds: Norvasc 10 mg daily  . Permissive hypertension (OK if < 220/120) but gradually normalize in 3-5 days . Long-term BP goal normotensive  Hyperlipidemia  Home meds: None  LDL 136 goal < 70  On Lipitor 80  Continue statin on discharge  Other Stroke Risk Factors  Family hx stroke Pateint reports both  siblings and mother +stroke. Mother passed away from stroke, one brother a year younger than him died from stroke and his remaining brother has significant paralysis s/p stroke   Obstructive sleep apnea on CPAP  Other Active Problems  ANA positive - seeing rheumatology  This plan of care was directed by Dr. Roda Shutters. Janey Genta, NP-C  Hospital day # 1  ATTENDING NOTE: I reviewed above note and agree with the assessment and plan.   Pt doing well, no acute event overnight. Worked with PT OT and now recommend outpt PT OT.  Continue DAPT for 3 weeks and then aspirin alone.  Continue Lipitor.  Stroke risk factor modification.  No follow-up in clinic.  Neurology will sign off. Please call with questions. Pt will follow up with stroke clinic NP at [facility] in about 4 weeks. Thanks for the consult.   Marvel Plan, MD PhD Stroke Neurology 05/30/2020 5:12 PM       To contact Stroke Continuity provider, please refer to [email or web address]. After hours, contact General Neurology

## 2020-06-05 ENCOUNTER — Ambulatory Visit: Payer: 59 | Admitting: Physical Therapy

## 2020-06-05 ENCOUNTER — Encounter: Payer: 59 | Admitting: Occupational Therapy

## 2020-06-06 ENCOUNTER — Encounter: Payer: Self-pay | Admitting: Family Medicine

## 2020-06-06 ENCOUNTER — Ambulatory Visit (INDEPENDENT_AMBULATORY_CARE_PROVIDER_SITE_OTHER): Payer: 59 | Admitting: Family Medicine

## 2020-06-06 ENCOUNTER — Other Ambulatory Visit: Payer: Self-pay

## 2020-06-06 VITALS — BP 124/78 | HR 86 | Temp 97.8°F | Ht 67.0 in | Wt 179.6 lb

## 2020-06-06 DIAGNOSIS — I69198 Other sequelae of nontraumatic intracerebral hemorrhage: Secondary | ICD-10-CM | POA: Diagnosis not present

## 2020-06-06 DIAGNOSIS — R2689 Other abnormalities of gait and mobility: Secondary | ICD-10-CM | POA: Diagnosis not present

## 2020-06-06 DIAGNOSIS — I639 Cerebral infarction, unspecified: Secondary | ICD-10-CM

## 2020-06-06 DIAGNOSIS — G4733 Obstructive sleep apnea (adult) (pediatric): Secondary | ICD-10-CM | POA: Diagnosis not present

## 2020-06-06 DIAGNOSIS — I10 Essential (primary) hypertension: Secondary | ICD-10-CM

## 2020-06-06 NOTE — Progress Notes (Signed)
[facility] [address] [place] [address] Dept: [phone] Dept Fax: [phone]  New Patient Office Visit  Subjective:    Patient ID: Manuel Peters, male    DOB: 1960/12/16, 60 y.o..   MRN: [id number]  Chief Complaint  Patient presents with  . Establish Care    New patient, hospital follow up admitted for stroke.    History of Present Illness:  Patient is in today to establish care. Manuel Peters is originally from the [place]. His primary langauge is Spanish, though he does fairly well with [place]. His wife, Manuel Peters, is here with him, who has stronger [place] proficiency.  Manuel Peters was recently in the hospital (1/26-1/28) with an acute ischemic stroke. MRI showed evidence of a small are of ischemia in the central midbrain. He was stabilized and discharged on Lipitor, aspirin, and Plavix. He has PT and OT referrals (2/17) and neurology follow-up with Ihor Austin, NP on 2/24. Manuel Peters is having a bit of unsteadiness and his wife feels it is more left-sided. He is using a walker at home right now. His wife also feels his speech is a little slower than prior to this episode.  Manuel Peters had a positive COVID test during his admission. He notes he was surprised by this, as he never had overt symptoms clearly related to COVID. He denies any dyspnea.  Past Medical History: Patient Active Problem List   Diagnosis Date Noted  . Acute ischemic stroke (HCC) 05/29/2020  . Sleep apnea   . Congenital absence of one kidney 07/26/2019  . Uses Spanish as primary spoken language 07/26/2019  . Vitiligo 07/26/2019  . Deviated septum 07/11/2014  . Multiple nasal polyps 07/11/2014  . Essential hypertension 06/20/2014  . Allergic rhinitis 06/20/2014  . Back pain 07/31/2010   Past Surgical History:  Procedure Laterality Date  . COLONOSCOPY    . HERNIA REPAIR  1980s   inguinal   . NASAL SEPTOPLASTY W/ TURBINOPLASTY  Bilateral 07/11/2014   Procedure: NASAL SEPTOPLASTY WITH BILATERAL INFERIOR TURBINATE REDUCTION;  Surgeon: Osborn Coho, MD;  Location: [facility];  Service: ENT;  Laterality: Bilateral;  . SINUS ENDO W/FUSION Bilateral 07/11/2014   Procedure: BILATERAL ENDOSCOPIC SINUS SURGERY/RESECTION OF INTRA NASAL POLYPS WITH FUSION SCAN;  Surgeon: Osborn Coho, MD;  Location: [facility];  Service: ENT;  Laterality: Bilateral;   Family History  Problem Relation Age of Onset  . Hypertension Other        M, B  . Hypertension Mother    Outpatient Medications Prior to Visit  Medication Sig Dispense Refill  . amLODipine (NORVASC) 10 MG tablet Take 10 mg by mouth daily.    Marland Kitchen aspirin EC 81 MG EC tablet Take 1 tablet (81 mg total) by mouth daily. Swallow whole. 30 tablet 11  . atorvastatin (LIPITOR) 80 MG tablet Take 1 tablet (80 mg total) by mouth daily. 30 tablet 2  . clopidogrel (PLAVIX) 75 MG tablet Take 1 tablet (75 mg total) by mouth daily. 30 tablet 02  . lisinopril (ZESTRIL) 20 MG tablet Take 20 mg by mouth daily.    . meclizine (ANTIVERT) 25 MG tablet Take 1 tablet (25 mg total) by mouth 3 (three) times daily as needed for dizziness. 30 tablet 0   No facility-administered medications prior to visit.   No Known Allergies    Objective:   Today's Vitals   06/06/20 0944  BP: 124/78  Pulse: 86  Temp: 97.8  F (36.6 C)  TempSrc: Temporal  SpO2: 96%  Weight: 179 lb 9.6 oz (81.5 kg)  Height: 5\' 7"  (1.702 m)   Body mass index is 28.13 kg/m.   General: Well developed, well nourished. No acute distress. HEENT: Normocephalic, non-traumatic. PERRL, EOMI. Conjunctiva clear. External ears normal. EAC and TMs normal bilaterally. Nose clear without congestion or rhinorrhea. Mucous membranes moist. Oropharynx clear. Good dentition. Neck: Supple. No lymphadenopathy. No thyromegaly or bruits. Lungs: Clear to auscultation bilaterally. CV: RRR without murmurs or rubs.  Pulses 2+ bilaterally. Extremities: Full ROM. Strength 5/5 and equal bilaterally. No joint swelling or tenderness. No edema noted. Skin: Warm and dry. No rashes. Neuro:CN II-XII intact. Normal sensation and DTR bilaterally. Psych: Alert and oriented. Normal mood and affect.  Health Maintenance Due  Topic Date Due  . Hepatitis C Screening  Never done  . COLONOSCOPY (Pts 45-49yrs Insurance coverage will need to be confirmed)  Never done  . COVID-19 Vaccine (3 - Booster for Pfizer series) 02/06/2020     Assessment & Plan:   1. Acute ischemic stroke [facility]) Manuel Peters had a recent midbrain ischemic stroke. He is apparently having some unsteadiness of his gait and possible slow speech, though his words are clear. He has been started on aspirin, Plavix, and Lipitor. We will continue this for now. He has PT/OT and neurology referrals. I will plan to see him back in 3 months.  2. Essential hypertension At goal on lisinopril and amlodipine.  3. Obstructive sleep apnea syndrome Use CPAP and is followed by neurology.  Wyvonnia Lora, MD

## 2020-06-07 ENCOUNTER — Other Ambulatory Visit: Payer: 59

## 2020-06-09 ENCOUNTER — Ambulatory Visit: Payer: 59 | Admitting: Physical Therapy

## 2020-06-09 ENCOUNTER — Encounter: Payer: 59 | Admitting: Occupational Therapy

## 2020-06-16 ENCOUNTER — Other Ambulatory Visit: Payer: 59

## 2020-06-16 DIAGNOSIS — Z20822 Contact with and (suspected) exposure to covid-19: Secondary | ICD-10-CM

## 2020-06-17 LAB — SARS-COV-2, NAA 2 DAY TAT

## 2020-06-17 LAB — NOVEL CORONAVIRUS, NAA: SARS-CoV-2, NAA: NOT DETECTED

## 2020-06-19 ENCOUNTER — Ambulatory Visit: Payer: 59

## 2020-06-19 ENCOUNTER — Other Ambulatory Visit: Payer: Self-pay

## 2020-06-19 ENCOUNTER — Ambulatory Visit: Payer: 59 | Attending: Internal Medicine | Admitting: Occupational Therapy

## 2020-06-19 DIAGNOSIS — R531 Weakness: Secondary | ICD-10-CM

## 2020-06-19 DIAGNOSIS — M6281 Muscle weakness (generalized): Secondary | ICD-10-CM | POA: Insufficient documentation

## 2020-06-19 DIAGNOSIS — R2689 Other abnormalities of gait and mobility: Secondary | ICD-10-CM | POA: Insufficient documentation

## 2020-06-19 DIAGNOSIS — R2681 Unsteadiness on feet: Secondary | ICD-10-CM | POA: Diagnosis present

## 2020-06-19 DIAGNOSIS — R278 Other lack of coordination: Secondary | ICD-10-CM | POA: Diagnosis present

## 2020-06-19 NOTE — Therapy (Signed)
[facility] [address] [place], [place], [address] Phone: [phone]   Fax:  [phone]  Physical Therapy Evaluation  Patient Details  Name: Manuel Peters MRN: [id number] Date of Birth: 01/19/1961 Referring Provider (PT): Carma LeavenWilliam Lancaster MD   Encounter Date: 06/19/2020   PT End of Session - 06/19/20 1424    Visit Number 1    Number of Visits 8    Date for PT Re-Evaluation 07/19/20    Authorization Type [employer]    PT Start Time 1100    PT Stop Time 1145    PT Time Calculation (min) 45 min    Equipment Utilized During Treatment Gait belt    Activity Tolerance Patient tolerated treatment well    Behavior During Therapy [facility] for tasks assessed/performed;Impulsive           Past Medical History:  Diagnosis Date  . Elevated BP   . Hypertension   . Sinus congestion   . Sleep apnea    does not use a cpap  . Stroke [facility](HCC)     Past Surgical History:  Procedure Laterality Date  . COLONOSCOPY  2018   Reportedly normal. Repeat recommended in 10 years.  Marland Kitchen. HERNIA REPAIR  1980s   inguinal   . NASAL SEPTOPLASTY W/ TURBINOPLASTY Bilateral 07/11/2014   Procedure: NASAL SEPTOPLASTY WITH BILATERAL INFERIOR TURBINATE REDUCTION;  Surgeon: Osborn Cohoavid Shoemaker, MD;  Location: [facility];  Service: ENT;  Laterality: Bilateral;  . SINUS ENDO W/FUSION Bilateral 07/11/2014   Procedure: BILATERAL ENDOSCOPIC SINUS SURGERY/RESECTION OF INTRA NASAL POLYPS WITH FUSION SCAN;  Surgeon: Osborn Cohoavid Shoemaker, MD;  Location: [facility];  Service: ENT;  Laterality: Bilateral;    There were no vitals filed for this visit.    Subjective Assessment - 06/19/20 1109    Subjective Reports balance issues and difficulty walking drifting to R.  Dtr Manuel Peters report simpulsive behavior, no pain, feels he is improving with time, wants to focus on balance training    Patient is accompained by: Family member    Pertinent  History dtr Manuel Peters    Limitations Walking    How long can you sit comfortably? unlimited    How long can you stand comfortably? unrestricted    How long can you walk comfortably? pending LOB episodes    Currently in Pain? No/denies    Pain Score 0-No pain              [facility] PT Assessment - 06/19/20 0001      Assessment   Medical Diagnosis CVA    Referring Provider (PT) Carma LeavenWilliam Lancaster MD    Onset Date/Surgical Date 05/28/20    Next MD Visit 06/26/20    Prior Therapy CIR      Precautions   Precautions Fall      Home Environment   Living Environment Private residence    Living Arrangements Spouse/significant other;Parent    Available Help at Discharge Family    Type of Home House    Home Access Stairs to enter    Entrance Stairs-Number of Steps 1    Entrance Stairs-Rails None    Home Layout One level    Home Equipment StanfordWalker - 2 wheels      Prior Function   Level of Independence Independent    Vocation Full time employment    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      Observation/Other Assessments   Focus on Therapeutic Outcomes (FOTO)  58%  Sensation   Light Touch Appears Intact      ROM / Strength   AROM / PROM / Strength Strength;AROM      Strength   Overall Strength Within functional limits for tasks performed;Deficits    Overall Strength Comments 4/5 R DF strenght      Transfers   Transfers Sit to Stand    Sit to Stand 5: Supervision    Sit to Stand Details (indicate cue type and reason) 12.72s      High Level Balance   High Level Balance Comments able to maintain all 4 positions of MCTSIB w/o LOB      Functional Gait  Assessment   Gait assessed  Yes    Gait Level Surface Walks 20 ft in less than 7 sec but greater than 5.5 sec, uses assistive device, slower speed, mild gait deviations, or deviates 6-10 in outside of the 12 in walkway width.    Change in Gait Speed Able to smoothly change walking speed without loss of balance or gait deviation.  Deviate no more than 6 in outside of the 12 in walkway width.    Gait with Horizontal Head Turns Performs head turns smoothly with slight change in gait velocity (eg, minor disruption to smooth gait path), deviates 6-10 in outside 12 in walkway width, or uses an assistive device.    Gait with Vertical Head Turns Performs head turns with no change in gait. Deviates no more than 6 in outside 12 in walkway width.    Gait and Pivot Turn Pivot turns safely in greater than 3 sec and stops with no loss of balance, or pivot turns safely within 3 sec and stops with mild imbalance, requires small steps to catch balance.    Step Over Obstacle Is able to step over 2 stacked shoe boxes taped together (9 in total height) without changing gait speed. No evidence of imbalance.    Gait with Narrow Base of Support Is able to ambulate for 10 steps heel to toe with no staggering.    Gait with Eyes Closed Walks 20 ft, uses assistive device, slower speed, mild gait deviations, deviates 6-10 in outside 12 in walkway width. Ambulates 20 ft in less than 9 sec but greater than 7 sec.    Ambulating Backwards Walks 20 ft, uses assistive device, slower speed, mild gait deviations, deviates 6-10 in outside 12 in walkway width.    Steps Alternating feet, no rail.    Total Score 25                      Objective measurements completed on examination: See above findings.                 PT Short Term Goals - 06/19/20 1431      PT SHORT TERM GOAL #1   Title Patient to remain free of falls    Baseline no flls since DC from hosp    Time 2    Period Weeks    Status New    Target Date 07/03/20      PT SHORT TERM GOAL #2   Title Patient to demo HEP back to PT w/o cuing once issued    Baseline no HEP    Time 2    Period Weeks    Status New    Target Date 07/03/20      PT SHORT TERM GOAL #3   Title Patient to ambulate with most appropriate AD  Baseline using mothers walking stick    Time 2     Period Weeks    Status New    Target Date 07/03/20             PT Long Term Goals - 06/19/20 1438      PT LONG TERM GOAL #1   Title decrease 5x STS time to <10s w/o UE support    Baseline 12.72 seconds w/o UE support    Time 4    Period Weeks    Status New    Target Date 07/17/20      PT LONG TERM GOAL #2   Title Ambulate 1067ft with LRAD under distant S w/o LOB episodes    Baseline in clinic distances    Time 4    Period Weeks    Status New    Target Date 07/17/20      PT LONG TERM GOAL #3   Title Increase FGA score to 28    Baseline initial FGA score 25    Time 4    Period Weeks    Status New    Target Date 07/17/20      PT LONG TERM GOAL #4   Title Increasae R DF strength to 4+/5    Baseline 4/5 strength at eval    Time 4    Period Weeks    Status New    Target Date 08/14/20                  Plan - 06/19/20 1425    Clinical Impression Statement [name] was recently in the hospital (1/26-1/28) with an acute ischemic stroke. MRI showed evidence of a small are of ischemia in the central midbrain. He was stabilized and discharged on Lipitor, aspirin, and Plavix. He has PT and OT referrals (2/17) and neurology follow-up with Ihor Austin, NP on 2/24. [name] is having a bit of unsteadiness and his wife feels it is more left-sided. He is using a walker at home right now. His wife also feels his speech is a little slower than prior to this episode.  Today he is ambulating with walking stick, some drift to R when walking, overall strength and sensation in LEs is functional, staic balance is good but deficits noted with dynamic balance tasks.  Patient is a good candidate to benefit from skilled PT intervention    Personal Factors and Comorbidities Comorbidity 1    Comorbidities HTN    Stability/Clinical Decision Making Stable/Uncomplicated    Clinical Decision Making Low    Rehab Potential Good    PT Frequency 2x / week    PT Duration 4 weeks    PT  Treatment/Interventions DME Instruction;Gait training;Stair training;Therapeutic activities;Therapeutic exercise;Balance training;Neuromuscular re-education;Patient/family education;Vestibular;Orthotic Fit/Training    PT Next Visit Plan establish program focused on balance retraining and safety awareness, stepping strategies and balance challenges vision removed    PT Home Exercise Plan none issued this date other than slow transfers and safety awareness encouraged    Recommended Other Services OT ordered    Consulted and Agree with Plan of Care Patient;Family member/caregiver    Family Member Consulted dtr Manuel Bonds           Patient will benefit from skilled therapeutic intervention in order to improve the following deficits and impairments:  Abnormal gait,Decreased balance,Decreased endurance,Decreased mobility,Difficulty walking,Dizziness,Decreased activity tolerance,Decreased safety awareness,Decreased strength  Visit Diagnosis: Unsteadiness on feet - Plan: PT plan of care cert/re-cert  Decreased strength - Plan: PT  plan of care cert/re-cert     Problem List Patient Active Problem List   Diagnosis Date Noted  . Acute ischemic stroke (HCC) 05/29/2020  . Sleep apnea   . Congenital absence of one kidney 07/26/2019  . Uses Spanish as primary spoken language 07/26/2019  . Vitiligo 07/26/2019  . Deviated septum 07/11/2014  . Multiple nasal polyps 07/11/2014  . Essential hypertension 06/20/2014  . Allergic rhinitis 06/20/2014  . Back pain 07/31/2010    Hildred Laser PT 06/19/2020, 2:54 PM  [facility] [address] [place], [place], [address] Phone: [phone]   Fax:  [phone]  Name: Manuel Peters MRN: [id number] Date of Birth: 1960-07-31

## 2020-06-19 NOTE — Therapy (Signed)
[facility] [address] [place], [place], [address] Phone: [phone]   Fax:  [phone]  Occupational Therapy Evaluation  Patient Details  Name: Manuel Peters MRN: [id number] Date of Birth: Jul 14, 1960 Referring Provider (OT): Dr. Veto Kemps (ref by Dr Natale Milch will send to PCP)   Encounter Date: 06/19/2020   OT End of Session - 06/19/20 1624    Visit Number 1    Number of Visits 9    Date for OT Re-Evaluation 07/24/20    Authorization Type [employer] plan    OT Start Time 1151    OT Stop Time 1220    OT Time Calculation (min) 29 min    Activity Tolerance Patient tolerated treatment well    Behavior During Therapy [facility] for tasks assessed/performed;          Past Medical History:  Diagnosis Date  . Elevated BP   . Hypertension   . Sinus congestion   . Sleep apnea    does not use a cpap  . Stroke [facility])     Past Surgical History:  Procedure Laterality Date  . COLONOSCOPY  2018   Reportedly normal. Repeat recommended in 10 years.  Marland Kitchen HERNIA REPAIR  1980s   inguinal   . NASAL SEPTOPLASTY W/ TURBINOPLASTY Bilateral 07/11/2014   Procedure: NASAL SEPTOPLASTY WITH BILATERAL INFERIOR TURBINATE REDUCTION;  Surgeon: Osborn Coho, MD;  Location: [facility];  Service: ENT;  Laterality: Bilateral;  . SINUS ENDO W/FUSION Bilateral 07/11/2014   Procedure: BILATERAL ENDOSCOPIC SINUS SURGERY/RESECTION OF INTRA NASAL POLYPS WITH FUSION SCAN;  Surgeon: Osborn Coho, MD;  Location: [facility];  Service: ENT;  Laterality: Bilateral;    There were no vitals filed for this visit.   Subjective Assessment - 06/19/20 1154    Patient Stated Goals Improve balance    Currently in Pain? No/denies             [facility] OT Assessment - 06/19/20 1155      Assessment   Medical Diagnosis CVA    Referring Provider (OT) Dr. Veto Kemps   ref by Dr Natale Milch will send to PCP   Onset Date/Surgical Date  05/28/20    Hand Dominance Right    Next MD Visit 06/26/20      Precautions   Precautions Fall      Balance Screen   Has the patient fallen in the past 6 months No    Has the patient had a decrease in activity level because of a fear of falling?  No    Is the patient reluctant to leave their home because of a fear of falling?  No      Home  Environment   Family/patient expects to be discharged to: Private residence    Home Access --   1 step   Lives With Spouse      Prior Function   Level of Independence Independent    Vocation Full time employment    Higher education careers adviser      ADL   Eating/Feeding Modified independent    Grooming Modified independent    Upper Body Bathing Modified independent    Lower Body Bathing Supervision/safety    Upper Body Dressing Independent    Lower Body Dressing Modified independent    Tub/Shower Transfer Supervision/safety      IADL   Light Housekeeping Does not participate in any housekeeping tasks    Meal Prep Able to complete simple cold meal and snack  prep    Medication Management Is responsible for taking medication in correct dosages at correct time    Financial Management --   Pt's wife handled before hand     Mobility   Mobility Status --   supervision-mod I     Written Expression   Dominant Hand Right    Handwriting 100% legible      Vision - History   Additional Comments Deneis visual changes      Vision Assessment   Ocular Range of Motion Within Functional Limits    Tracking/Visual Pursuits Able to track stimulus in all quads without difficulty      Activity Tolerance   Activity Tolerance Endurance does not limit participation in activity      Cognition   Overall Cognitive Status Within Functional Limits for tasks assessed      Sensation   Light Touch Appears Intact   reports intermittant numbness with right arm pressing down at elbow forearm     Coordination   Fine Motor Movements are Fluid and  Coordinated No    9 Hole Peg Test Right;Left    Right 9 Hole Peg Test 35.94    Left 9 Hole Peg Test 31.84    Tremors tremor present, 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, and he had previously      ROM / Strength   AROM / PROM / Strength AROM;Strength      AROM   Overall AROM  Within functional limits for tasks performed      Strength   Overall Strength Deficits    Overall Strength Comments bilateral proximal shoulder strength 4/5, distal 5/5      Hand Function   Right Hand Grip (lbs) 82.6    Left Hand Grip (lbs) 67.6                                OT Long Term Goals - 06/19/20 1312      OT LONG TERM GOAL #1   Title I with HEP for RUE coordination, LUE grip and proximal shoulder strength    Time 4    Period Weeks    Status New    Target Date 07/17/20      OT LONG TERM GOAL #2   Title Pt will demonstrate improved fine motor coordination as evidenced by  decreasing RUE 9 hole peg test score to 32 secs or less    Time 4    Period Weeks    Status New      OT LONG TERM GOAL #3   Title Pt will increase LUE grip strength by 4 lbs for increased functional use.    Time 4    Period Weeks    Status New      OT LONG TERM GOAL #4   Title Pt will perform shower transfers mod I    Time 4    Period Weeks    Status New      OT LONG TERM GOAL #5   Title Pt will demonstrate ability to perfrom transitional movements for ADLS/ work activities without LOB    Time 4    Period Weeks    Status New                 Plan - 06/19/20 1627    Clinical Impression Statement 60 y.o. male with medical history significant for hypertension and OSA, now presenting to the emergency department for evaluation  of acute onset dizziness, nausea, and vomiting. MRI + 21mm diffusion abnormality involving central area of the midbrain; vaccinated; Covid + at hospital . Pt lives with his wife and works at a [employer]. Pt presents with the following deficits: decreased balance,  decreased strength, decreased coordination which impedes perfromance of ADLS/ IADLs. Pt can benefit from skilled OT to address these deficits in order to maximize pt's safety and I with daily activities.    OT Occupational Profile and History Problem Focused Assessment - Including review of records relating to presenting problem    Occupational performance deficits (Please refer to evaluation for details): ADL's;IADL's;Work;Leisure;Social Participation    Body Structure / Function / Physical Skills ADL;Balance;UE functional use;FMC;ROM;Gait;Coordination;GMC;Sensation;Decreased knowledge of use of DME;Dexterity;Strength;Mobility    Rehab Potential Good    Clinical Decision Making Limited treatment options, no task modification necessary    Comorbidities Affecting Occupational Performance: May have comorbidities impacting occupational performance    Modification or Assistance to Complete Evaluation  No modification of tasks or assist necessary to complete eval    OT Frequency 2x / week   plus eval or 9 visits total   OT Duration 4 weeks    OT Treatment/Interventions Self-care/ADL training;Ultrasound;Visual/perceptual remediation/compensation;Energy conservation;DME and/or AE instruction;Patient/family education;Balance training;Passive range of motion;Electrical Stimulation;Functional Mobility Training;Splinting;Moist Heat;Therapeutic exercise;Manual Therapy;Therapeutic activities;Neuromuscular education;Paraffin    Plan initate HEP RUE coordination/ LUE grip strength,    Consulted and Agree with Plan of Care Patient           Patient will benefit from skilled therapeutic intervention in order to improve the following deficits and impairments:   Body Structure / Function / Physical Skills: ADL,Balance,UE functional use,FMC,ROM,Gait,Coordination,GMC,Sensation,Decreased knowledge of use of DME,Dexterity,Strength,Mobility       Visit Diagnosis: Other lack of coordination - Plan: Ot plan of care  cert/re-cert  Muscle weakness (generalized) - Plan: Ot plan of care cert/re-cert  Other abnormalities of gait and mobility - Plan: Ot plan of care cert/re-cert  Unsteadiness on feet - Plan: Ot plan of care cert/re-cert    Problem List Patient Active Problem List   Diagnosis Date Noted  . Acute ischemic stroke (HCC) 05/29/2020  . Sleep apnea   . Congenital absence of one kidney 07/26/2019  . Uses Spanish as primary spoken language 07/26/2019  . Vitiligo 07/26/2019  . Deviated septum 07/11/2014  . Multiple nasal polyps 07/11/2014  . Essential hypertension 06/20/2014  . Allergic rhinitis 06/20/2014  . Back pain 07/31/2010    Carle Dargan 06/19/2020, 4:37 PM Keene Breath, OTR/L [phone] Phone: [phone] 4:37 PM 06/19/20 [facility] [address] [place], [place], [address] Phone: [phone]   Fax:  [phone]  Name: Dax Murguia MRN: [id number] Date of Birth: 1960/05/11

## 2020-06-26 ENCOUNTER — Encounter: Payer: Self-pay | Admitting: Adult Health

## 2020-06-26 ENCOUNTER — Other Ambulatory Visit: Payer: Self-pay

## 2020-06-26 ENCOUNTER — Ambulatory Visit: Payer: 59 | Admitting: Adult Health

## 2020-06-26 VITALS — BP 137/81 | HR 75 | Ht 67.0 in | Wt 184.0 lb

## 2020-06-26 DIAGNOSIS — I639 Cerebral infarction, unspecified: Secondary | ICD-10-CM | POA: Diagnosis not present

## 2020-06-26 NOTE — Patient Instructions (Signed)
Continue to participate in neuro rehab physical and Occupational Therapy for hopeful ongoing recovery  Graduated return to driving as recommended.  It is recommended that you first drive with another licensed driver in an empty parking lot. If you do well with this, you can drive on a quiet street with the licensed driver.  If you do well with this, you can drive on a busy street with a licensed driver.  If you continue to do well, you can be cleared to drive independently.  For the first month after resuming driving, it is recommend no nighttime, busy/heavy traffic roads or Interstate driving.   Continue aspirin 81 mg daily  and atorvastatin 80 mg daily for secondary stroke prevention  Stop plavix as this is no longer needed - only needed to be on for 3 weeks  Continue to follow up with PCP regarding cholesterol and blood pressure management  Maintain strict control of hypertension with blood pressure goal below 130/90 and cholesterol with LDL cholesterol (bad cholesterol) goal below 70 mg/dL.   At next visit, please bring your CPAP machine with you so we can start monitoring       Followup in the future with me in 4 months or call earlier if needed       Thank you for coming to see [place] at [facility]. I hope we have been able to provide you high quality care today.  You may receive a patient satisfaction survey over the next few weeks. We would appreciate your feedback and comments so that we may continue to improve ourselves and the health of our patients.

## 2020-06-26 NOTE — Progress Notes (Signed)
[facility] [address] [place]. [place] [address] [phone]       HOSPITAL FOLLOW UP NOTE  [name] Date of Birth:  [birth year] Medical Record Number:  [id number]   Reason for Referral:  hospital stroke follow up    SUBJECTIVE:   CHIEF COMPLAINT:  Chief Complaint  Patient presents with  . Follow-up    TR with daughter [name]) Pt is well, no symptoms     HPI:   Manuel Peters a 60 y.o.malewith medical history significant forhypertension and OSA on CPAP, and recently seen by rheumatology for positive ANA.  He presented to [facility] ED on 05/28/2020 for acute onset of dizziness associated with nausea and vomiting and found to have significant nystagmus and truncal lean upon exam as well as Covid positive.  Personally reviewed hospitalization pertinent progress notes, lab work and imaging with summary provided.  Evaluated by Dr. 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  Recommended DAPT for 3 weeks and aspirin alone.  LDL 136 and initiate atorvastatin 80 mg daily.  Other stroke risk factors include HTN, asymptomatic COVID infection, significant family history of stroke (mother and sibling died from stroke and remaining brother significant paralysis s/p stroke) and OSA on CPAP.  No personal prior history of stroke.  Evaluated by therapies and recommended OP PT/OT discharged home in stable condition.   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   CTA head & neck: Normal head CT. No acute intracranial abnormality identified.   MRI: Subtle 8 mm ill-defined diffusion signal abnormality involving the central aspect of the midbrain, suspicious for possible acute small vessel ischemia given the patient's history. No associated hemorrhage or mass effect.  Carotid Doppler unremarkable  2D Echo EF 60-65%  LDL  136  HgbA1c 5.6  Covid testing positive  UDS  negative  VTE prophylaxis is recommended.   Passed bedside swallow eval. On heart healthy diet.   Recommend aspirin and Plavix DAPT for 3 weeks and then aspirin alone.  Therapy recommendations:  CIR --> OP PT/OT  Disposition:  home   Today, 06/26/2020, [name] is being seen for hospital follow-up accompanied by his daughter who assists with interpretation.  Residual imbalance more so towards the left side (or leaning towards left) but overall improvement since discharge He will occasionally have vertigo when laying in bed but this has greatly improvement.  Denies any other dizziness or vertigo Started working with neuro rehab PT/OT rehab - has 2nd appointment next week He requests return to driving as well as potentially returning back to work currently working for a [employer] who is willing to work with him as far as restrictions -he has been doing different activities around his home as well as activities he be doing at work and he has done these without difficulty Denies new or worsening stroke/TIA symptoms  Completed 3 weeks DAPT and remains on aspirin alone without bleeding or bruising Remains on atorvastatin 80 mg daily without myalgias Blood pressure today 137/81 OSA on CPAP - reports nightly compliance     ROS:   14 system review of systems performed and negative with exception of those listed in HPI  PMH:  Past Medical History:  Diagnosis Date  . Elevated BP   . Hypertension   . Sinus congestion   . Sleep apnea    does not use a cpap  . Stroke [facility])     PSH:  Past Surgical History:  Procedure Laterality Date  . COLONOSCOPY  2018   Reportedly normal. Repeat recommended in 10 years.  Marland Kitchen HERNIA REPAIR  1980s   inguinal   . NASAL SEPTOPLASTY W/ TURBINOPLASTY Bilateral 07/11/2014   Procedure: NASAL SEPTOPLASTY WITH BILATERAL INFERIOR TURBINATE REDUCTION;  Surgeon: Osborn Coho, MD;  Location: [facility];  Service: ENT;  Laterality: Bilateral;   . SINUS ENDO W/FUSION Bilateral 07/11/2014   Procedure: BILATERAL ENDOSCOPIC SINUS SURGERY/RESECTION OF INTRA NASAL POLYPS WITH FUSION SCAN;  Surgeon: Osborn Coho, MD;  Location: [facility];  Service: ENT;  Laterality: Bilateral;    Social History:  Social History   Socioeconomic History  . Marital status: Married    Spouse name: [name]  . Number of children: Not on file  . Years of education: Not on file  . Highest education level: Not on file  Occupational History  . Not on file  Tobacco Use  . Smoking status: Never Smoker  . Smokeless tobacco: Never Used  Vaping Use  . Vaping Use: Never used  Substance and Sexual Activity  . Alcohol use: Yes    Comment: rarely  . Drug use: No  . Sexual activity: Not on file  Other Topics Concern  . Not on file  Social History Narrative   Original from [place]         Social Determinants of Health   Financial Resource Strain: Not on file  Food Insecurity: Not on file  Transportation Needs: Not on file  Physical Activity: Not on file  Stress: Not on file  Social Connections: Not on file  Intimate Partner Violence: Not on file    Family History:  Family History  Problem Relation Age of Onset  . Hypertension Other        M, B  . Hypertension Mother     Medications:   Current Outpatient Medications on File Prior to Visit  Medication Sig Dispense Refill  . amLODipine (NORVASC) 10 MG tablet Take 10 mg by mouth daily.    Marland Kitchen aspirin EC 81 MG EC tablet Take 1 tablet (81 mg total) by mouth daily. Swallow whole. 30 tablet 11  . atorvastatin (LIPITOR) 80 MG tablet Take 1 tablet (80 mg total) by mouth daily. 30 tablet 2  . clopidogrel (PLAVIX) 75 MG tablet Take 1 tablet (75 mg total) by mouth daily. 30 tablet 02  . lisinopril (ZESTRIL) 20 MG tablet Take 20 mg by mouth daily.     No current facility-administered medications on file prior to visit.    Allergies:  No Known  Allergies    OBJECTIVE:  Physical Exam  Vitals:   06/26/20 1001  BP: 137/81  Pulse: 75  Weight: 184 lb (83.5 kg)  Height: 5\' 7"  (1.702 m)   Body mass index is 28.82 kg/m. No exam data present   Post stroke PHQ 2/9 Depression screen PHQ 2/9 06/06/2020  Decreased Interest 0  Down, Depressed, Hopeless 0  PHQ - 2 Score 0     General: well developed, well nourished, pleasant elderly male, seated, in no evident distress Head: head normocephalic and atraumatic.   Neck: supple with no carotid or supraclavicular bruits Cardiovascular: regular rate and rhythm, no murmurs Musculoskeletal: no deformity Skin:  no rash/petichiae Vascular:  Normal pulses all extremities   Neurologic Exam Mental Status: Awake and fully alert. Denies dysarthria or aphasia. Primary language of Spanish with some broken 08/04/2020. Oriented to place and time. Recent and remote memory intact.  Attention span, concentration and fund of knowledge appropriate. Mood and affect appropriate.  Cranial Nerves: Fundoscopic exam reveals sharp disc margins. Pupils equal, briskly reactive to light. Extraocular movements full without nystagmus. Visual fields full to confrontation. Hearing intact. Facial sensation intact. Face, tongue, palate moves normally and symmetrically.  Motor: Normal bulk and tone. Normal strength in all tested extremity muscles Sensory.: intact to touch , pinprick , position and vibratory sensation.  Coordination: Rapid alternating movements normal in all extremities. Finger-to-nose and heel-to-shin performed accurately bilaterally. Gait and Station: Arises from chair without difficulty. Stance is normal. Gait demonstrates mild swaying towards the left when walking too quickly.  Able to tandem walk and heel toe without difficulty.  Romberg slightly positive with eyes closed Reflexes: 1+ and symmetric. Toes downgoing.     NIHSS  0 Modified Rankin  1-2      ASSESSMENT: Manuel Peters is a 60  y.o. 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 Vascular risk factors include HTN, HLD, OSA on CPAP and significant family history of stroke.      PLAN:  1. Midbrain stroke:  a. Residual deficit: mild imbalance but overall greatly improving - continue working with PT for likely ongoing recovery. Cleared to return back to driving on a graduated return to driving basis. Cleared to return back to work with reduced hours and restrictions such as no climbing on ladders, working at [employer], use of large power tools or heavy equiptment.  b. Continue aspirin 81 mg daily  and atorvastatin 80 mg daily for secondary stroke prevention.   c. Discussed secondary stroke prevention measures and importance of close PCP follow up for aggressive stroke risk factor management  2. HTN: BP goal <130/90.  Stable on amlodipine per PCP 3. HLD: LDL goal <70. Recent LDL 136 -initiate atorvastatin 80 mg daily during recent stroke admission.  4. OSA on CPAP: previously followed by [facility] neurology - per insurance, he needs to establish care with [facility]. Advised him to bring machine with him at follow up visit to assist with establishing care    Follow up in 4 months or call earlier if needed   CC:  [facility] provider: Dr. Torrie Mayers, Bertram Millard, MD    I spent 45 minutes of face-to-face and non-face-to-face time with patient and daughter.  This included previsit chart review, lab review, study review, order entry, electronic health record documentation, patient education regarding recent stroke, residual deficits, importance of managing stroke risk factors and answered all other questions to patient and daughters satisfaction   Ihor Austin, [facility]  [facility] [address] [place], [place]  [address]  Phone [phone] Fax [phone] Note: This document was prepared with digital dictation and possible smart phrase technology. Any transcriptional errors that result from this process are unintentional.

## 2020-07-01 NOTE — Progress Notes (Signed)
I agree with the above plan 

## 2020-07-02 ENCOUNTER — Ambulatory Visit: Payer: 59 | Attending: Internal Medicine

## 2020-07-02 ENCOUNTER — Ambulatory Visit: Payer: 59 | Admitting: Occupational Therapy

## 2020-07-02 ENCOUNTER — Encounter: Payer: Self-pay | Admitting: Occupational Therapy

## 2020-07-02 ENCOUNTER — Other Ambulatory Visit: Payer: Self-pay

## 2020-07-02 DIAGNOSIS — R531 Weakness: Secondary | ICD-10-CM | POA: Diagnosis present

## 2020-07-02 DIAGNOSIS — M6281 Muscle weakness (generalized): Secondary | ICD-10-CM | POA: Insufficient documentation

## 2020-07-02 DIAGNOSIS — R278 Other lack of coordination: Secondary | ICD-10-CM | POA: Insufficient documentation

## 2020-07-02 DIAGNOSIS — R2681 Unsteadiness on feet: Secondary | ICD-10-CM | POA: Insufficient documentation

## 2020-07-02 DIAGNOSIS — R2689 Other abnormalities of gait and mobility: Secondary | ICD-10-CM | POA: Diagnosis present

## 2020-07-02 NOTE — Therapy (Signed)
[facility] [address] [place], [place], [address] Phone: [phone]   Fax:  [phone]  Physical Therapy Treatment  Patient Details  Name: Freeman Borba MRN: [id number] Date of Birth: 1960/05/27 Referring Provider (PT): Carma Leaven MD   Encounter Date: 07/02/2020   PT End of Session - 07/02/20 1101    Visit Number 2    Number of Visits 8    Date for PT Re-Evaluation 07/19/20    Authorization Type [employer]    PT Start Time 1015    PT Stop Time 1100    PT Time Calculation (min) 45 min    Equipment Utilized During Treatment Gait belt    Activity Tolerance Patient tolerated treatment well    Behavior During Therapy [facility] for tasks assessed/performed;Impulsive           Past Medical History:  Diagnosis Date  . Elevated BP   . Hypertension   . Sinus congestion   . Sleep apnea    does not use a cpap  . Stroke [facility])     Past Surgical History:  Procedure Laterality Date  . COLONOSCOPY  2018   Reportedly normal. Repeat recommended in 10 years.  Marland Kitchen HERNIA REPAIR  1980s   inguinal   . NASAL SEPTOPLASTY W/ TURBINOPLASTY Bilateral 07/11/2014   Procedure: NASAL SEPTOPLASTY WITH BILATERAL INFERIOR TURBINATE REDUCTION;  Surgeon: Osborn Coho, MD;  Location: [facility];  Service: ENT;  Laterality: Bilateral;  . SINUS ENDO W/FUSION Bilateral 07/11/2014   Procedure: BILATERAL ENDOSCOPIC SINUS SURGERY/RESECTION OF INTRA NASAL POLYPS WITH FUSION SCAN;  Surgeon: Osborn Coho, MD;  Location: [facility];  Service: ENT;  Laterality: Bilateral;    There were no vitals filed for this visit.                      [facility] Adult PT Treatment/Exercise - 07/02/20 0001      Ambulation/Gait   Ambulation/Gait Yes    Ambulation/Gait Assistance 5: Supervision    Ambulation/Gait Assistance Details ambualtion outside across level and grass surfaces    Ambulation Distance  (Feet) 1000 Feet    Assistive device None    Gait Pattern Step-through pattern    Ambulation Surface Level;Unlevel;Indoor;Outdoor;Grass               Balance Exercises - 07/02/20 0001      Balance Exercises: Standing   Standing Eyes Opened Wide (BOA);Solid surface;2 reps;30 secs;Limitations;Head turns    Standing Eyes Opened Limitations performed in corner adding head turns/nods 10x ea.    Standing Eyes Closed Wide (BOA);Head turns;Solid surface;2 reps;30 secs;Limitations    Standing Eyes Closed Limitations perfored with head turns/nods 10x ea.    Tandem Stance Eyes open;Eyes closed;Intermittent upper extremity support;1 rep;30 secs;Limitations    Tandem Stance Time performed in corner in ea. position , EO/EC, tending to lose balance with LLE fwd in EC/EO positions    Tandem Gait Forward;Retro;Intermittent upper extremity support;Foam/compliant surface;5 reps;Limitations    Tandem Gait Limitations performed in // bars across blue foam beam    Sidestepping Foam/compliant support;5 reps;Theraband;Limitations    Theraband Level (Sidestepping) Level 3 (Green)    Sidestepping Limitations performed side stepping in // bars across blue foam beam for 5 trips then advanced to resistance against green band at counter for 5 trips             PT Education - 07/02/20 1100    Education Details Access Code: [email or web address]  URL: [email or web address]  Date: 07/02/2020  Prepared by: Gustavus Bryant    Exercises  Standing Balance in Corner with Eyes Closed - 1 x daily - 7 x weekly - 2 sets - 30 hold  Standing Near Stance in Corner with Eyes Closed - 1 x daily - 7 x weekly - 2 sets - 30 hold    Person(s) Educated Patient   interpretter   Methods Explanation;Demonstration;Handout    Comprehension Verbalized understanding;Returned demonstration;Tactile cues required            PT Short Term Goals - 07/02/20 1035      PT SHORT TERM GOAL #1   Title Patient to remain free of falls     Baseline no flls since DC from hosp; 07/02/20 denies fall or LOB since lastsession    Time 2    Period Weeks    Status Achieved    Target Date 07/03/20      PT SHORT TERM GOAL #2   Title Patient to demo HEP back to PT w/o cuing once issued    Baseline no HEP; Issued standing balance with EO/EC    Time 2    Period Weeks    Status On-going    Target Date 07/03/20      PT SHORT TERM GOAL #3   Title Patient to ambulate with most appropriate AD    Baseline using mothers walking stick; 07/02/20 patient as begun ambulating w/o an AD and no reported difficulty    Time 2    Period Weeks    Status Achieved    Target Date 07/03/20             PT Long Term Goals - 06/19/20 1438      PT LONG TERM GOAL #1   Title decrease 5x STS time to <10s w/o UE support    Baseline 12.72 seconds w/o UE support    Time 4    Period Weeks    Status New    Target Date 07/17/20      PT LONG TERM GOAL #2   Title Ambulate 1029ft with LRAD under distant S w/o LOB episodes    Baseline in clinic distances    Time 4    Period Weeks    Status New    Target Date 07/17/20      PT LONG TERM GOAL #3   Title Increase FGA score to 28    Baseline initial FGA score 25    Time 4    Period Weeks    Status New    Target Date 07/17/20      PT LONG TERM GOAL #4   Title Increasae R DF strength to 4+/5    Baseline 4/5 strength at eval    Time 4    Period Weeks    Status New    Target Date 08/14/20      PT LONG TERM GOAL #5   Title improve FOTO score to 75%    Baseline 58%    Time 4    Period Weeks    Status New    Target Date 07/17/20                 Plan - 07/02/20 1102    Clinical Impression Statement Focus of todays skilled session was high level gait and balance training and negotiation of outside surfaces including grass, he is able to tandem walk across foam beam /wo UE support as well as sidestep and braid across same  beam.  Added balance challenges in corner with EO/EC incorporatn tandem  positions as well.  Only LOB issues noted were with tandem standing tasks with LLE in front.    Personal Factors and Comorbidities Comorbidity 1    Comorbidities HTN    Stability/Clinical Decision Making Stable/Uncomplicated    Rehab Potential Good    PT Frequency 2x / week    PT Duration 4 weeks    PT Treatment/Interventions DME Instruction;Gait training;Stair training;Therapeutic activities;Therapeutic exercise;Balance training;Neuromuscular re-education;Patient/family education;Vestibular;Orthotic Fit/Training    PT Next Visit Plan continue balance execises and focus on vision removed and dual tasking with ambulation    PT Home Exercise Plan issued corner stance with EO/EC, wide and narrow BOS           Patient will benefit from skilled therapeutic intervention in order to improve the following deficits and impairments:  Abnormal gait,Decreased balance,Decreased endurance,Decreased mobility,Difficulty walking,Dizziness,Decreased activity tolerance,Decreased safety awareness,Decreased strength  Visit Diagnosis: Unsteadiness on feet  Muscle weakness (generalized)     Problem List Patient Active Problem List   Diagnosis Date Noted  . Acute ischemic stroke (HCC) 05/29/2020  . Sleep apnea   . Congenital absence of one kidney 07/26/2019  . Uses Spanish as primary spoken language 07/26/2019  . Vitiligo 07/26/2019  . Deviated septum 07/11/2014  . Multiple nasal polyps 07/11/2014  . Essential hypertension 06/20/2014  . Allergic rhinitis 06/20/2014  . Back pain 07/31/2010    Hildred Laser PT 07/02/2020, 11:22 AM   [facility] [address] [place], [place], [address] Phone: [phone]   Fax:  [phone]  Name: Trevon Strothers MRN: [id number] Date of Birth: August 13, 1960

## 2020-07-02 NOTE — Patient Instructions (Addendum)
  Coordination Activities  Perform the following activities for 20 minutes 2 times per day with right hand(s).   Rotate ball in fingertips (clockwise and counter-clockwise).  Toss ball between hands.  Toss ball in air and catch with the same hand.  Flip cards 1 at a time as fast as you can.  Deal cards with your thumb (Hold deck in hand and push card off top with thumb).  Rotate card in hand (clockwise and counter-clockwise).  Shuffle cards.  Pick up coins, buttons, marbles, dried beans/pasta of different sizes and place in container.  Pick up coins and place in container or coin bank.  Pick up coins and stack.  Pick up coins one at a time until you get 5-10 in your hand, then move coins from palm to fingertips to stack one at a time.  Twirl pen between fingers.  Practice writing and/or typing.  Screw together nuts and bolts, then unfasten.     1. Grip Strengthening (Resistive Putty)   Squeeze putty using thumb and all fingers. Repeat _20___ times. Do __2__ sessions per day.   2. Roll putty into tube on table and pinch between each finger and thumb x 10 reps each. (can do ring and small finger together)     Copyright  VHI. All rights reserved.

## 2020-07-02 NOTE — Patient Instructions (Signed)
Access Code: [email or web address] URL: [email or web address] Date: 07/02/2020 Prepared by: Gustavus Bryant  Exercises Standing Balance in Corner with Eyes Closed - 1 x daily - 7 x weekly - 2 sets - 30 hold Standing Near Stance in Corner with Eyes Closed - 1 x daily - 7 x weekly - 2 sets - 30 hold

## 2020-07-02 NOTE — Therapy (Signed)
[facility] [address] [place], [place], [address] Phone: [phone]   Fax:  [phone]  Occupational Therapy Treatment & Discharge  Patient Details  Name: Manuel Peters MRN: [id number] Date of Birth: 1961/03/17 Referring Provider (OT): Dr. Gena Fray (ref by Dr Avon Gully will send to PCP)   Encounter Date: 07/02/2020   OT End of Session - 07/02/20 1100    Visit Number 2    Number of Visits 9    Date for OT Re-Evaluation 07/24/20    Authorization Type [employer]    OT Start Time 1059    OT Stop Time 1135   pt discharge   OT Time Calculation (min) 36 min    Activity Tolerance Patient tolerated treatment well    Behavior During Therapy [facility] for tasks assessed/performed;Impulsive           Past Medical History:  Diagnosis Date   Elevated BP    Hypertension    Sinus congestion    Sleep apnea    does not use a cpap   Stroke [facility])     Past Surgical History:  Procedure Laterality Date   COLONOSCOPY  2018   Reportedly normal. Repeat recommended in 10 years.   HERNIA REPAIR  1980s   inguinal    NASAL SEPTOPLASTY W/ TURBINOPLASTY Bilateral 07/11/2014   Procedure: NASAL SEPTOPLASTY WITH BILATERAL INFERIOR TURBINATE REDUCTION;  Surgeon: Jerrell Belfast, MD;  Location: [place];  Service: ENT;  Laterality: Bilateral;   SINUS ENDO W/FUSION Bilateral 07/11/2014   Procedure: BILATERAL ENDOSCOPIC SINUS SURGERY/RESECTION OF INTRA NASAL POLYPS WITH FUSION SCAN;  Surgeon: Jerrell Belfast, MD;  Location: [place];  Service: ENT;  Laterality: Bilateral;    There were no vitals filed for this visit.   Subjective Assessment - 07/02/20 1104    Subjective  Pt denies any pain today. Pt reports progress since evaluation    Patient is accompanied by: Interpreter   Manuel Peters with [facility]   Patient Stated Goals Improve balance    Currently in Pain? No/denies              OCCUPATIONAL THERAPY DISCHARGE SUMMARY  Visits from Start of Care: 2  Current functional level related to goals / functional outcomes: Pt has made incredible gains since evaluation. Pt has met all goals and verbalized and demonstrated understanding of HEPs for grip strengthening and coordination for increasing overall functional use of BUE.    Remaining deficits: Some slight coordination deficits in RUE   Education / Equipment: HEPs with grip strength in LUE and coordination in RUE  Plan: Patient agrees to discharge.  Patient goals were met. Patient is being discharged due to meeting the stated rehab goals.  ?????                 OT Treatments/Exercises (OP) - 07/02/20 1120      Exercises   Exercises Hand;Work Hardening      Work Hardening Exercises   UBE (Upper Arm Bike) Arm bike x 8 minutes on level 2 for conditioning for BUE      Fine Motor Coordination (Hand/Wrist)   Fine Motor Coordination Grooved pegs    Grooved pegs with RUE with appropriate time and no difficulty                  OT Education - 07/02/20 1103    Education Details Coordination HEP for RUE and green theraputty for LUE - see pt instructions  Person(s) Educated Patient    Methods Explanation;Demonstration    Comprehension Verbalized understanding;Returned demonstration               OT Long Term Goals - 07/02/20 1104      OT LONG TERM GOAL #1   Title I with HEP for RUE coordination, LUE grip and proximal shoulder strength    Time 4    Period Weeks    Status Achieved      OT LONG TERM GOAL #2   Title Pt will demonstrate improved fine motor coordination as evidenced by  decreasing RUE 9 hole peg test score to 32 secs or less    Time 4    Period Weeks    Status Achieved   32.5s  RUE     OT LONG TERM GOAL #3   Title Pt will increase LUE grip strength by 4 lbs for increased functional use.    Time 4    Period Weeks    Status Achieved   90.3 lbs     OT LONG TERM GOAL  #4   Title Pt will perform shower transfers mod I    Time 4    Period Weeks    Status Achieved      OT LONG TERM GOAL #5   Title Pt will demonstrate ability to perfrom transitional movements for ADLS/ work activities without LOB    Time 4    Period Weeks    Status Achieved                 Plan - 07/02/20 1112    Clinical Impression Statement Pt is making progress towards reaching goals for OT. Pt has met all goals. Pt has made incredible gains since evaluation. Pt has met all goals and verbalized and demonstrated understanding of HEPs for grip strengthening and coordination for increasing overall functional use of BUE.    OT Occupational Profile and History Problem Focused Assessment - Including review of records relating to presenting problem    Occupational performance deficits (Please refer to evaluation for details): ADL's;IADL's;Work;Leisure;Social Participation    Body Structure / Function / Physical Skills ADL;Balance;UE functional use;FMC;ROM;Gait;Coordination;GMC;Sensation;Decreased knowledge of use of DME;Dexterity;Strength;Mobility    Rehab Potential Good    Clinical Decision Making Limited treatment options, no task modification necessary    Comorbidities Affecting Occupational Performance: May have comorbidities impacting occupational performance    Modification or Assistance to Complete Evaluation  No modification of tasks or assist necessary to complete eval    OT Frequency 2x / week   plus eval or 9 visits total   OT Duration 4 weeks    OT Treatment/Interventions Self-care/ADL training;Ultrasound;Visual/perceptual remediation/compensation;Energy conservation;DME and/or AE instruction;Patient/family education;Balance training;Passive range of motion;Electrical Stimulation;Functional Mobility Training;Splinting;Moist Heat;Therapeutic exercise;Manual Therapy;Therapeutic activities;Neuromuscular education;Paraffin    Plan discharge from OT    Consulted and Agree with  Plan of Care Patient           Patient will benefit from skilled therapeutic intervention in order to improve the following deficits and impairments:   Body Structure / Function / Physical Skills: ADL,Balance,UE functional use,FMC,ROM,Gait,Coordination,GMC,Sensation,Decreased knowledge of use of DME,Dexterity,Strength,Mobility       Visit Diagnosis: Unsteadiness on feet  Other lack of coordination  Muscle weakness (generalized)  Other abnormalities of gait and mobility    Problem List Patient Active Problem List   Diagnosis Date Noted   Acute ischemic stroke ([place]) 05/29/2020   Sleep apnea    Congenital absence of one kidney 07/26/2019  Uses Spanish as primary spoken language 07/26/2019   Vitiligo 07/26/2019   Deviated septum 07/11/2014   Multiple nasal polyps 07/11/2014   Essential hypertension 06/20/2014   Allergic rhinitis 06/20/2014   Back pain 07/31/2010    Zachery Conch MOT, OTR/L  07/02/2020, 11:36 AM  [facility] [address] [place], [place], [address] Phone: [phone]   Fax:  [phone]  Name: Tristen Luce MRN: [id number] Date of Birth: [birth year]

## 2020-07-03 ENCOUNTER — Ambulatory Visit: Payer: 59 | Admitting: Occupational Therapy

## 2020-07-03 ENCOUNTER — Encounter: Payer: Self-pay | Admitting: Physical Therapy

## 2020-07-03 ENCOUNTER — Ambulatory Visit: Payer: 59 | Admitting: Physical Therapy

## 2020-07-03 DIAGNOSIS — R531 Weakness: Secondary | ICD-10-CM

## 2020-07-03 DIAGNOSIS — R2681 Unsteadiness on feet: Secondary | ICD-10-CM

## 2020-07-04 NOTE — Therapy (Signed)
[facility] [address] [place], [place], [address] Phone: [phone]   Fax:  [phone]  Physical Therapy Treatment  Patient Details  Name: Manuel Peters MRN: [id number] Date of Birth: 1960/07/14 Referring Provider (PT): Carma Leaven MD   Encounter Date: 07/03/2020   PT End of Session - 07/03/20 1237    Visit Number 3    Number of Visits 8    Date for PT Re-Evaluation 07/19/20    Authorization Type [employer]    PT Start Time 1232    PT Stop Time 1315    PT Time Calculation (min) 43 min    Equipment Utilized During Treatment Gait belt    Activity Tolerance Patient tolerated treatment well    Behavior During Therapy [facility] for tasks assessed/performed           Past Medical History:  Diagnosis Date  . Elevated BP   . Hypertension   . Sinus congestion   . Sleep apnea    does not use a cpap  . Stroke [facility])     Past Surgical History:  Procedure Laterality Date  . COLONOSCOPY  2018   Reportedly normal. Repeat recommended in 10 years.  Marland Kitchen HERNIA REPAIR  1980s   inguinal   . NASAL SEPTOPLASTY W/ TURBINOPLASTY Bilateral 07/11/2014   Procedure: NASAL SEPTOPLASTY WITH BILATERAL INFERIOR TURBINATE REDUCTION;  Surgeon: Osborn Coho, MD;  Location: [facility];  Service: ENT;  Laterality: Bilateral;  . SINUS ENDO W/FUSION Bilateral 07/11/2014   Procedure: BILATERAL ENDOSCOPIC SINUS SURGERY/RESECTION OF INTRA NASAL POLYPS WITH FUSION SCAN;  Surgeon: Osborn Coho, MD;  Location: [facility];  Service: ENT;  Laterality: Bilateral;    There were no vitals filed for this visit.   Subjective Assessment - 07/03/20 1236    Subjective No new complaints. No falls or pain to report.    Pertinent History dtr [name]    Limitations Walking    How long can you sit comfortably? unlimited    How long can you stand comfortably? unrestricted    How long can you walk comfortably? pending  LOB episodes    Currently in Pain? No/denies    Pain Score 0-No pain                   [facility] Adult PT Treatment/Exercise - 07/03/20 1237      Transfers   Transfers Sit to Stand;Stand to Sit    Sit to Stand 6: Modified independent (Device/Increase time)    Stand to Sit 6: Modified independent (Device/Increase time)      Ambulation/Gait   Ambulation/Gait Yes    Ambulation/Gait Assistance 5: Supervision;4: Min guard    Ambulation/Gait Assistance Details gait outdoors including up/down grassy hills around trees.    Ambulation Distance (Feet) 1000 Feet   x1, plus around gym with session   Assistive device None    Gait Pattern Step-through pattern    Ambulation Surface Level;Unlevel;Indoor;Outdoor;Paved;Grass      High Level Balance   High Level Balance Activities Marching forwards;Marching backwards;Tandem walking   tandem fwd/bwd   High Level Balance Comments on red/blue mats next to counter: no UE support with occasional touch to counter as needed for 3 laps each. min guard to min assist for safety with cues on posture, weight shifitng and technique.      Neuro Re-ed    Neuro Re-ed Details  for balance/muscle re-ed: gait around track while tossing ball, naming foods a-h with  min guard assist, cues to task and leading cues needed for foods except for ones he knew a Spanish word for.               Balance Exercises - 07/03/20 1300      Balance Exercises: Standing   Rockerboard Anterior/posterior;Head turns;EO;EC;30 seconds;Other reps (comment);Limitations    Rockerboard Limitations ant/post direction only- rocking the board with emphasis on tall posture with EO progressing to EC. then holding the board steady for EC 30 sec's x 3 reps, progressing to EC head movements left<>right, up<>down. min guard to min assist for balance with no UE support on bars. cues for posture and weight shifitng to assist wtih balance with pt demo'ing a posterior bias.    Sit to Stand Standard  surface;Foam/compliant surface;Without upper extremity support;Limitations    Sit to Stand Limitations seated at edge of low mat with feet across red foam beam: sit<>stand x 10 reps min guard assist. emphasis on tall posture and slow, controlled descent back to mat.               PT Short Term Goals - 07/02/20 1035      PT SHORT TERM GOAL #1   Title Patient to remain free of falls    Baseline no flls since DC from hosp; 07/02/20 denies fall or LOB since lastsession    Time 2    Period Weeks    Status Achieved    Target Date 07/03/20      PT SHORT TERM GOAL #2   Title Patient to demo HEP back to PT w/o cuing once issued    Baseline no HEP; Issued standing balance with EO/EC    Time 2    Period Weeks    Status On-going    Target Date 07/03/20      PT SHORT TERM GOAL #3   Title Patient to ambulate with most appropriate AD    Baseline using mothers walking stick; 07/02/20 patient as begun ambulating w/o an AD and no reported difficulty    Time 2    Period Weeks    Status Achieved    Target Date 07/03/20             PT Long Term Goals - 06/19/20 1438      PT LONG TERM GOAL #1   Title decrease 5x STS time to <10s w/o UE support    Baseline 12.72 seconds w/o UE support    Time 4    Period Weeks    Status New    Target Date 07/17/20      PT LONG TERM GOAL #2   Title Ambulate 1065ft with LRAD under distant S w/o LOB episodes    Baseline in clinic distances    Time 4    Period Weeks    Status New    Target Date 07/17/20      PT LONG TERM GOAL #3   Title Increase FGA score to 28    Baseline initial FGA score 25    Time 4    Period Weeks    Status New    Target Date 07/17/20      PT LONG TERM GOAL #4   Title Increasae R DF strength to 4+/5    Baseline 4/5 strength at eval    Time 4    Period Weeks    Status New    Target Date 08/14/20      PT LONG TERM GOAL #5   Title  improve FOTO score to 75%    Baseline 58%    Time 4    Period Weeks    Status New     Target Date 07/17/20                 Plan - 07/03/20 1237    Clinical Impression Statement Today's skilled session continued to focus on high level balance training and dual tasking with no issues noted or reported in session. The pt is progressing toward goals and should benefit from continued PT to progress toward unmet goals.    Personal Factors and Comorbidities Comorbidity 1    Comorbidities HTN    Stability/Clinical Decision Making Stable/Uncomplicated    Rehab Potential Good    PT Frequency 2x / week    PT Duration 4 weeks    PT Treatment/Interventions DME Instruction;Gait training;Stair training;Therapeutic activities;Therapeutic exercise;Balance training;Neuromuscular re-education;Patient/family education;Vestibular;Orthotic Fit/Training    PT Next Visit Plan continue balance execises and focus on vision removed and dual tasking with ambulation    PT Home Exercise Plan issued corner stance with EO/EC, wide and narrow BOS           Patient will benefit from skilled therapeutic intervention in order to improve the following deficits and impairments:  Abnormal gait,Decreased balance,Decreased endurance,Decreased mobility,Difficulty walking,Dizziness,Decreased activity tolerance,Decreased safety awareness,Decreased strength  Visit Diagnosis: Unsteadiness on feet  Decreased strength     Problem List Patient Active Problem List   Diagnosis Date Noted  . Acute ischemic stroke ([facility]) 05/29/2020  . Sleep apnea   . Congenital absence of one kidney 07/26/2019  . Uses Spanish as primary spoken language 07/26/2019  . Vitiligo 07/26/2019  . Deviated septum 07/11/2014  . Multiple nasal polyps 07/11/2014  . Essential hypertension 06/20/2014  . Allergic rhinitis 06/20/2014  . Back pain 07/31/2010    Sallyanne Kuster, PTA, [facility] Outpatient Neuro [facility] [address], [address] [place], [place] [address] [phone] 07/04/20, 9:29 AM   Name: Manuel Peters MRN:  [id number] Date of Birth: [birth year]

## 2020-07-09 ENCOUNTER — Other Ambulatory Visit: Payer: Self-pay

## 2020-07-09 ENCOUNTER — Ambulatory Visit: Payer: 59 | Admitting: Physical Therapy

## 2020-07-09 ENCOUNTER — Ambulatory Visit: Payer: 59 | Admitting: Occupational Therapy

## 2020-07-09 ENCOUNTER — Telehealth: Payer: Self-pay | Admitting: Family Medicine

## 2020-07-09 ENCOUNTER — Encounter: Payer: Self-pay | Admitting: Physical Therapy

## 2020-07-09 DIAGNOSIS — R2681 Unsteadiness on feet: Secondary | ICD-10-CM | POA: Diagnosis not present

## 2020-07-09 DIAGNOSIS — R531 Weakness: Secondary | ICD-10-CM

## 2020-07-09 DIAGNOSIS — I10 Essential (primary) hypertension: Secondary | ICD-10-CM

## 2020-07-09 MED ORDER — LISINOPRIL 20 MG PO TABS: 20.0000 mg | ORAL_TABLET | Freq: Every day | ORAL | 3 refills | Status: DC

## 2020-07-09 MED ORDER — AMLODIPINE BESYLATE 10 MG PO TABS: 10.0000 mg | ORAL_TABLET | Freq: Every day | ORAL | 3 refills | Status: DC

## 2020-07-09 NOTE — Telephone Encounter (Signed)
Refill request for:  Amlodipine 10 mg LR  Hx provider  Lisinopril 20 mg LR  Hx provider LOV 06/06/20 FOV 09/03/20  Please review and advise.  Thanks. Dm/cma

## 2020-07-09 NOTE — Telephone Encounter (Signed)
Pt wife calling and said that he needs a refill on  1. amLODipine (NORVASC) 10 MG tablet 2. lisinopril (ZESTRIL) 20 MG tablet They are requesting 90 D/S on both and sent to [facility] on [address] [place], [place] [address]

## 2020-07-10 NOTE — Telephone Encounter (Signed)
Patients wife, [name], notified VIA phone. Dm/cma

## 2020-07-10 NOTE — Therapy (Signed)
[facility] [address] [place], [place], [address] Phone: [phone]   Fax:  [phone]  Physical Therapy Treatment  Patient Details  Name: Manuel Peters MRN: [id number] Date of Birth: 26-Mar-1961 Referring Provider (PT): Carma Leaven MD   Encounter Date: 07/09/2020      07/09/20 1106  PT Visits / Re-Eval  Visit Number 4  Number of Visits 8  Date for PT Re-Evaluation 07/19/20  Authorization  Authorization Type [employer]  PT Time Calculation  PT Start Time 1103  PT Stop Time 1145  PT Time Calculation (min) 42 min  PT - End of Session  Equipment Utilized During Treatment Gait belt  Activity Tolerance Patient tolerated treatment well  Behavior During Therapy [facility] for tasks assessed/performed    Past Medical History:  Diagnosis Date  . Elevated BP   . Hypertension   . Sinus congestion   . Sleep apnea    does not use a cpap  . Stroke [facility])     Past Surgical History:  Procedure Laterality Date  . COLONOSCOPY  2018   Reportedly normal. Repeat recommended in 10 years.  Marland Kitchen HERNIA REPAIR  1980s   inguinal   . NASAL SEPTOPLASTY W/ TURBINOPLASTY Bilateral 07/11/2014   Procedure: NASAL SEPTOPLASTY WITH BILATERAL INFERIOR TURBINATE REDUCTION;  Surgeon: Osborn Coho, MD;  Location: [facility];  Service: ENT;  Laterality: Bilateral;  . SINUS ENDO W/FUSION Bilateral 07/11/2014   Procedure: BILATERAL ENDOSCOPIC SINUS SURGERY/RESECTION OF INTRA NASAL POLYPS WITH FUSION SCAN;  Surgeon: Osborn Coho, MD;  Location: [facility];  Service: ENT;  Laterality: Bilateral;    There were no vitals filed for this visit.      07/09/20 1106  Symptoms/Limitations  Subjective No new complaints. No falls or pain to report.  Pertinent History  --   Limitations Walking  How long can you stand comfortably? unrestricted  How long can you walk comfortably? pending LOB episodes  Pain  Assessment  Currently in Pain? No/denies  Pain Score 0       07/09/20 1107  Transfers  Transfers Sit to Stand;Stand to Sit  Sit to Stand 6: Modified independent (Device/Increase time)  Stand to Sit 6: Modified independent (Device/Increase time)  Ambulation/Gait  Ambulation/Gait Yes  Ambulation/Gait Assistance 6: Modified independent (Device/Increase time);5: Supervision  Ambulation/Gait Assistance Details around gym with session  Assistive device None  Gait Pattern Step-through pattern  Ambulation Surface Level;Indoor  Neuro Re-ed   Neuro Re-ed Details  for balance/muscle re-ed:had pt engage in gait around the track with speed changes and scaning all directions randomly with supervison. No balance issues noted.       07/09/20 1114  Balance Exercises: Standing  SLS with Vectors Foam/compliant surface;Other reps (comment);Limitations  SLS with Vectors Limitations on balance board with 2 tall cones in front: alternating fwd, cross, fwd double, cross double foot taps to cones for ~10 reps each. cues for posture, stance position and weight shifting to assist with balance.  Rockerboard Anterior/posterior;Lateral;Head turns;EO;EC;30 seconds;Other reps (comment);Limitations  Rockerboard Limitations performed both ways on the balance board with no UE support: rocking the board with EO, progressing to [facility] with cues on posture/weight shifting to assist with balance; holding the board steady for EC 30 sec's x 3 reps, progressing to EC head movements left<>right, then up<>down for 10 reps each. min guard to min assist for balance.  Tandem Gait Forward;Retro;Intermittent upper extremity support;Foam/compliant surface;4 reps;Limitations  Tandem Gait Limitations blue foam beam with  occasional touch to bars for balance. cues on posture and foor placement on beam.  Sidestepping Foam/compliant support;4 reps;Limitations  Sidestepping Limitations blue foam beam with light UE support progressing to no UE  support with cues on posture and step length.  Other Standing Exercises BOSU blue side up: alternating forward step ups with contralateral march for 10 reps each side, then lateral stepping over BOSU (floor<>both feet on BOSU<>floor) for 5 reps toward each side  with light UE support, cues on form and technqiue; Inverted BOSU: single leg stance in center with contralateral forward>lateral>backward kics for 2 sets of 5 reps on each side with UE support on bars. cues on form/technique needed. Min guard assist for safety.         PT Short Term Goals - 07/02/20 1035      PT SHORT TERM GOAL #1   Title Patient to remain free of falls    Baseline no flls since DC from hosp; 07/02/20 denies fall or LOB since lastsession    Time 2    Period Weeks    Status Achieved    Target Date 07/03/20      PT SHORT TERM GOAL #2   Title Patient to demo HEP back to PT w/o cuing once issued    Baseline no HEP; Issued standing balance with EO/EC    Time 2    Period Weeks    Status On-going    Target Date 07/03/20      PT SHORT TERM GOAL #3   Title Patient to ambulate with most appropriate AD    Baseline using mothers walking stick; 07/02/20 patient as begun ambulating w/o an AD and no reported difficulty    Time 2    Period Weeks    Status Achieved    Target Date 07/03/20             PT Long Term Goals - 06/19/20 1438      PT LONG TERM GOAL #1   Title decrease 5x STS time to <10s w/o UE support    Baseline 12.72 seconds w/o UE support    Time 4    Period Weeks    Status New    Target Date 07/17/20      PT LONG TERM GOAL #2   Title Ambulate 1059ft with LRAD under distant S w/o LOB episodes    Baseline in clinic distances    Time 4    Period Weeks    Status New    Target Date 07/17/20      PT LONG TERM GOAL #3   Title Increase FGA score to 28    Baseline initial FGA score 25    Time 4    Period Weeks    Status New    Target Date 07/17/20      PT LONG TERM GOAL #4   Title Increasae R  DF strength to 4+/5    Baseline 4/5 strength at eval    Time 4    Period Weeks    Status New    Target Date 08/14/20      PT LONG TERM GOAL #5   Title improve FOTO score to 75%    Baseline 58%    Time 4    Period Weeks    Status New    Target Date 07/17/20             07/09/20 1106  Plan  Clinical Impression Statement Today's skilled session continued to focus on dynamic  gait and balance training with no issues noted or reported in session. The pt is making great progress toward goals and may be ready for discharge ahead of plan of care.  Personal Factors and Comorbidities Comorbidity 1  Comorbidities HTN  Pt will benefit from skilled therapeutic intervention in order to improve on the following deficits Abnormal gait;Decreased balance;Decreased endurance;Decreased mobility;Difficulty walking;Dizziness;Decreased activity tolerance;Decreased safety awareness;Decreased strength  Stability/Clinical Decision Making Stable/Uncomplicated  Rehab Potential Good  PT Frequency 2x / week  PT Duration 4 weeks  PT Treatment/Interventions DME Instruction;Gait training;Stair training;Therapeutic exercise;Balance training;Neuromuscular re-education;Patient/family education;Vestibular;Orthotic Fit/Training  PT Next Visit Plan continue balance execises and focus on vision removed and dual tasking with ambulation; pt may be ready for discharge ahead of plan of care  PT Home Exercise Plan issued corner stance with EO/EC, wide and narrow BOS        Patient will benefit from skilled therapeutic intervention in order to improve the following deficits and impairments:  Abnormal gait,Decreased balance,Decreased endurance,Decreased mobility,Difficulty walking,Dizziness,Decreased activity tolerance,Decreased safety awareness,Decreased strength  Visit Diagnosis: Unsteadiness on feet  Decreased strength     Problem List Patient Active Problem List   Diagnosis Date Noted  . Acute ischemic stroke  (H[facility]) 05/29/2020  . Sleep apnea   . Congenital absence of one kidney 07/26/2019  . Uses Spanish as primary spoken language 07/26/2019  . Vitiligo 07/26/2019  . Deviated septum 07/11/2014  . Multiple nasal polyps 07/11/2014  . Essential hypertension 06/20/2014  . Allergic rhinitis 06/20/2014  . Back pain 07/31/2010    Sallyanne Kuster, PTA, [facility] Outpatient Neuro [facility] [address], [address] [place], [place] [address] [phone] 07/10/20, 8:31 PM   Name: Djibril Glogowski MRN: [id number] Date of Birth: July 17, 1960

## 2020-07-11 ENCOUNTER — Ambulatory Visit: Payer: 59 | Admitting: Physical Therapy

## 2020-07-11 ENCOUNTER — Ambulatory Visit: Payer: 59 | Admitting: Occupational Therapy

## 2020-07-16 ENCOUNTER — Encounter: Payer: 59 | Admitting: Occupational Therapy

## 2020-07-16 ENCOUNTER — Ambulatory Visit: Payer: 59 | Admitting: Physical Therapy

## 2020-07-17 ENCOUNTER — Ambulatory Visit: Payer: 59 | Admitting: Physical Therapy

## 2020-07-17 ENCOUNTER — Encounter: Payer: 59 | Admitting: Occupational Therapy

## 2020-07-23 ENCOUNTER — Ambulatory Visit: Payer: 59

## 2020-07-23 ENCOUNTER — Encounter: Payer: 59 | Admitting: Occupational Therapy

## 2020-07-24 ENCOUNTER — Encounter: Payer: 59 | Admitting: Occupational Therapy

## 2020-07-24 ENCOUNTER — Ambulatory Visit: Payer: 59 | Admitting: Physical Therapy

## 2020-09-03 ENCOUNTER — Ambulatory Visit: Payer: 59 | Admitting: Family Medicine

## 2020-09-23 ENCOUNTER — Telehealth: Payer: Self-pay | Admitting: Adult Health

## 2020-09-23 ENCOUNTER — Telehealth: Payer: Self-pay | Admitting: Family Medicine

## 2020-09-23 DIAGNOSIS — E785 Hyperlipidemia, unspecified: Secondary | ICD-10-CM

## 2020-09-23 MED ORDER — ATORVASTATIN CALCIUM 80 MG PO TABS: 80.0000 mg | ORAL_TABLET | Freq: Every day | ORAL | 3 refills | Status: DC

## 2020-09-23 NOTE — Telephone Encounter (Signed)
Pt is needing a refill on his atorvastatin (LIPITOR) 80 MG tablet sent in to the [facility] on [address] [place], [place], [place] [address]

## 2020-09-23 NOTE — Telephone Encounter (Signed)
I called pt, he will call pcp for refills of atorvastatin.  He verbalized that he understood.

## 2020-09-23 NOTE — Telephone Encounter (Signed)
Please review message below and advise.   Thanks.  Dm/cma  

## 2020-09-23 NOTE — Telephone Encounter (Signed)
What is the name of the medication? atorvastatin (LIPITOR) 80 MG tablet [[id number]]    Have you contacted your pharmacy to request a refill? He is requesting a refill for this script. He has an upcoming appointment for a med check on 10/08/20 with Dr. Veto Kemps.   Which pharmacy would you like this sent to? Pharmacy  [facility] - [place], [place] - [address]  [address] [place], [place] [address]  Phone:  [phone] Fax:  [phone]  DEA #:  [id number]      Patient notified that their request is being sent to the clinical staff for review and that they should receive a call once it is complete. If they do not receive a call within 72 hours they can check with their pharmacy or our office.

## 2020-10-08 ENCOUNTER — Ambulatory Visit (INDEPENDENT_AMBULATORY_CARE_PROVIDER_SITE_OTHER): Payer: 59 | Admitting: Family Medicine

## 2020-10-08 ENCOUNTER — Encounter: Payer: Self-pay | Admitting: Family Medicine

## 2020-10-08 ENCOUNTER — Other Ambulatory Visit: Payer: Self-pay

## 2020-10-08 VITALS — BP 138/80 | HR 87 | Temp 98.1°F | Ht 67.0 in | Wt 180.0 lb

## 2020-10-08 DIAGNOSIS — Z8673 Personal history of transient ischemic attack (TIA), and cerebral infarction without residual deficits: Secondary | ICD-10-CM | POA: Diagnosis not present

## 2020-10-08 DIAGNOSIS — I10 Essential (primary) hypertension: Secondary | ICD-10-CM | POA: Diagnosis not present

## 2020-10-08 DIAGNOSIS — E785 Hyperlipidemia, unspecified: Secondary | ICD-10-CM | POA: Diagnosis not present

## 2020-10-08 MED ORDER — LISINOPRIL-HYDROCHLOROTHIAZIDE 20-12.5 MG PO TABS: 1.0000 | ORAL_TABLET | Freq: Every day | ORAL | 3 refills | Status: DC

## 2020-10-08 NOTE — Progress Notes (Signed)
[facility] [address] [place] [address] Dept: [phone] Dept Fax: [phone]  Chronic Care Office Visit  Subjective:    Patient ID: Manuel Peters, male    DOB: 06-Jun-1960, 60 y.o..   MRN: [id number]  Chief Complaint  Patient presents with  . Follow-up    F/u HTN and med refills.  No concerns.  Not fasting. Average BP at home 135/80    History of Present Illness:  Patient is in today for reassessment of chronic medical issues. His visit was assisted by a medical interpreter Nolberto Hanlon, # [phone])  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 He notes that his residual weakness/unsteadiness has now resolved. He did go to 2 physical therapy appointments, but then stopped, as he felt he was improving quite a bit. 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 He is taking a daily aspirin and is on amlodipine and lisinopril for his blood pressure. He notes his home blood pressures are averaging 135/80.  Past Medical History: Patient Active Problem List   Diagnosis Date Noted  . History of arterial ischemic stroke 10/08/2020  . Dyslipidemia (high LDL; low HDL) 09/23/2020  . Acute ischemic stroke (HCC) 05/29/2020  . Sleep apnea   . Congenital absence of one kidney 07/26/2019  . Uses Spanish as primary spoken language 07/26/2019  . Vitiligo 07/26/2019  . Deviated septum 07/11/2014  . Multiple nasal polyps 07/11/2014  . Essential hypertension 06/20/2014  . Allergic rhinitis 06/20/2014  . Back pain 07/31/2010   Past Surgical History:  Procedure Laterality Date  . COLONOSCOPY  2018   Reportedly normal. Repeat recommended in 10 years.  Marland Kitchen HERNIA REPAIR  1980s   inguinal   . NASAL SEPTOPLASTY W/ TURBINOPLASTY Bilateral 07/11/2014   Procedure: NASAL SEPTOPLASTY WITH BILATERAL INFERIOR TURBINATE REDUCTION;  Surgeon: Osborn Coho, MD;  Location: [facility];   Service: ENT;  Laterality: Bilateral;  . SINUS ENDO W/FUSION Bilateral 07/11/2014   Procedure: BILATERAL ENDOSCOPIC SINUS SURGERY/RESECTION OF INTRA NASAL POLYPS WITH FUSION SCAN;  Surgeon: Osborn Coho, MD;  Location: [facility];  Service: ENT;  Laterality: Bilateral;   Family History  Problem Relation Age of Onset  . Hypertension Other        M, B  . Hypertension Mother    Outpatient Medications Prior to Visit  Medication Sig Dispense Refill  . amLODipine (NORVASC) 10 MG tablet Take 1 tablet (10 mg total) by mouth daily. 90 tablet 3  . aspirin EC 81 MG EC tablet Take 1 tablet (81 mg total) by mouth daily. Swallow whole. 30 tablet 11  . atorvastatin (LIPITOR) 80 MG tablet Take 1 tablet (80 mg total) by mouth daily. 90 tablet 3  . lisinopril (ZESTRIL) 20 MG tablet Take 1 tablet (20 mg total) by mouth daily. 90 tablet 3   No facility-administered medications prior to visit.   No Known Allergies    Objective:   Today's Vitals   10/08/20 1055  BP: 138/80  Pulse: 87  Temp: 98.1 F (36.7 C)  TempSrc: Temporal  SpO2: 98%  Weight: 180 lb (81.6 kg)  Height: 5\' 7"  (1.702 m)   Body mass index is 28.19 kg/m.   General: Well developed, well nourished. No acute distress. Neuro: Speech is clear. Normal grip strength bilaterally. Psych: Alert and oriented. Normal mood and affect.  Health Maintenance Due  Topic Date Due  . Hepatitis C Screening  Never done  .  COLONOSCOPY (Pts 45-9yrs Insurance coverage will need to be confirmed)  Never done  . Zoster Vaccines- Shingrix (1 of 2) Never done  . COVID-19 Vaccine (3 - Booster for Pfizer series) 01/07/2020     Assessment & Plan:   1. History of arterial ischemic stroke Doing well overall. Sequelae of stroke appear to have resolved. We will continue him on a daily aspirin and focus on blood pressure and lipid control.  2. Essential hypertension Blood pressure remains above gaol. I will switch him to Zestoretic, in  addition to his amlodipine. We will reassess his blood pressure in 3 months.  - lisinopril-hydrochlorothiazide (ZESTORETIC) 20-12.5 MG tablet; Take 1 tablet by mouth daily.  Dispense: 90 tablet; Refill: 3  3. Dyslipidemia (high LDL; low HDL) I will have Mr. Farace return to [facility] for a fasting lipid panel to determine adequacy of his current statin therapy.  - Lipid panel; Future    Loyola Mast, MD

## 2020-10-09 ENCOUNTER — Other Ambulatory Visit (INDEPENDENT_AMBULATORY_CARE_PROVIDER_SITE_OTHER): Payer: 59

## 2020-10-09 DIAGNOSIS — E785 Hyperlipidemia, unspecified: Secondary | ICD-10-CM

## 2020-10-09 LAB — LIPID PANEL
Cholesterol: 186 mg/dL (ref 0–200)
HDL: 57.7 mg/dL (ref >39.00)
LDL Cholesterol: 118 mg/dL — ABNORMAL HIGH (ref 0–99)
NonHDL: 128.64
Total CHOL/HDL Ratio: 3
Triglycerides: 55 mg/dL (ref 0.0–149.0)
VLDL: 11 mg/dL (ref 0.0–40.0)

## 2020-10-09 NOTE — Progress Notes (Signed)
Per orders of Dr. Rudd pt is here for labs, pt tolerated draw well.  

## 2020-10-09 NOTE — Progress Notes (Signed)
Lipids are not at goal on high-dose atorvastatin. I recommend addition of Zetia (ezitimibe). Please contact patient to determine if he would be willing to start on this medication.

## 2020-10-17 MED ORDER — EZETIMIBE 10 MG PO TABS: 10.0000 mg | ORAL_TABLET | Freq: Every day | ORAL | 3 refills | Status: DC

## 2020-10-17 NOTE — Addendum Note (Signed)
Addended by: Loyola Mast on: 10/17/2020 05:33 PM   Modules accepted: Orders

## 2020-10-27 ENCOUNTER — Ambulatory Visit (INDEPENDENT_AMBULATORY_CARE_PROVIDER_SITE_OTHER): Payer: 59 | Admitting: Adult Health

## 2020-10-27 ENCOUNTER — Encounter: Payer: Self-pay | Admitting: Adult Health

## 2020-10-27 VITALS — BP 146/88 | HR 56 | Ht 67.0 in | Wt 180.0 lb

## 2020-10-27 DIAGNOSIS — I10 Essential (primary) hypertension: Secondary | ICD-10-CM | POA: Diagnosis not present

## 2020-10-27 DIAGNOSIS — E785 Hyperlipidemia, unspecified: Secondary | ICD-10-CM | POA: Diagnosis not present

## 2020-10-27 DIAGNOSIS — Z9989 Dependence on other enabling machines and devices: Secondary | ICD-10-CM

## 2020-10-27 DIAGNOSIS — I639 Cerebral infarction, unspecified: Secondary | ICD-10-CM

## 2020-10-27 DIAGNOSIS — G4733 Obstructive sleep apnea (adult) (pediatric): Secondary | ICD-10-CM | POA: Diagnosis not present

## 2020-10-27 NOTE — Patient Instructions (Addendum)
Continue aspirin 81 mg daily  and atorvastatin and Zetia for secondary stroke prevention  Ongoing nightly use of CPAP for sleep apnea management - please bring your CPAP machine to next visit   Continue to follow up with PCP regarding blood pressure and cholesterol management  Maintain strict control of hypertension with blood pressure goal below 130/90 and cholesterol with LDL cholesterol (bad cholesterol) goal below 70 mg/dL.      Followup in the future with me in 6 months or call earlier if needed     Thank you for coming to see [place] at [facility]. I hope we have been able to provide you high quality care today.  You may receive a patient satisfaction survey over the next few weeks. We would appreciate your feedback and comments so that we may continue to improve ourselves and the health of our patients.

## 2020-10-27 NOTE — Progress Notes (Signed)
[facility] [address] [place]. [place] [address] [phone]       STROKE FOLLOW UP NOTE  [name] Date of Birth:  [birth year] Medical Record Number:  [id number]   Reason for Referral: stroke follow up    SUBJECTIVE:   CHIEF COMPLAINT:  Chief Complaint  Patient presents with   Follow-up    RM 14 (has cone interpreter) Pt is well and stable, no complaints      HPI:   Today, 10/27/2020, Manuel Peters returns for 65-month stroke follow-up accompanied by [facility] interpreter.  He has been doing well since prior visit reporting complete recovery of prior deficits and denies new stroke/TIA symptoms.  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  Compliant on aspirin and atorvastatin 80 mg daily without associated side effects.  Recent lipid panel by PCP showed continued elevated LDL 118 therefore added Zetia 10 mg daily.  Blood pressure today 146/88 on lisinopril-hydrochlorothiazide per PCP. Monitors at home and typically stable - does report occasional elevated levels at home but typically checks BP in the AM prior to taking his BP medication -usually stable during. Use of CPAP nightly.  No further concerns at this time.    History provided for reference purposes only Initial visit 06/26/2020 JM: Manuel Peters is being seen for hospital follow-up accompanied by his daughter who assists with interpretation.  Residual imbalance more so towards the left side (or leaning towards left) but overall improvement since discharge He will occasionally have vertigo when laying in bed but this has greatly improvement.  Denies any other dizziness or vertigo Started working with neuro rehab PT/OT rehab - has 2nd appointment next week He requests return to driving as well as potentially returning back to work currently working for a Surveyor, [employer] who is willing to work with him as far as restrictions -he has been doing  different activities around his home as well as activities he be doing at work and he has done these without difficulty Denies new or worsening stroke/TIA symptoms  Completed 3 weeks DAPT and remains on aspirin alone without bleeding or bruising Remains on atorvastatin 80 mg daily without myalgias Blood pressure today 137/81 OSA on CPAP - reports nightly compliance    Stroke admission 05/28/2020 Manuel Peters is a 60 y.o. male with medical history significant for hypertension and OSA on CPAP, and recently seen by rheumatology for positive ANA.  He presented to [facility] ED on 05/28/2020 for acute onset of dizziness associated with nausea and vomiting and found to have significant nystagmus and truncal lean upon exam as well as Covid positive.  Personally reviewed hospitalization pertinent progress notes, lab work and imaging with summary provided.  Evaluated by Dr. 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  Recommended DAPT for 3 weeks and aspirin alone.  LDL 136 and initiate atorvastatin 80 mg daily.  Other stroke risk factors include HTN, asymptomatic COVID infection, significant family history of stroke (mother and sibling died from stroke and remaining brother significant paralysis s/p stroke) and OSA on CPAP.  No personal prior history of stroke.  Evaluated by therapies and recommended OP PT/OT discharged home in stable condition.    Stroke - Midbrain stroke likely related to SVD Code Stroke CT ASPECTS 10.  CTA head & neck: Normal head CT.  No acute intracranial abnormality identified.  MRI: Subtle 8 mm ill-defined diffusion signal abnormality involving the central aspect of the  midbrain, suspicious for possible acute small vessel ischemia given the patient's history. No associated hemorrhage or mass effect. Carotid Doppler unremarkable 2D Echo EF 60-65% LDL  136 HgbA1c 5.6 Covid testing positive UDS negative VTE prophylaxis is recommended.   Passed bedside swallow eval. On heart healthy diet.  Recommend aspirin and Plavix DAPT for 3 weeks and then aspirin alone. Therapy recommendations:  CIR --> OP PT/OT Disposition:  home    ROS:   14 system review of systems performed and negative with exception of those listed in HPI  PMH:  Past Medical History:  Diagnosis Date   Elevated BP    Hypertension    Sinus congestion    Sleep apnea    does not use a cpap   Stroke (HCC)     PSH:  Past Surgical History:  Procedure Laterality Date   COLONOSCOPY  2018   Reportedly normal. Repeat recommended in 10 years.   HERNIA REPAIR  1980s   inguinal    NASAL SEPTOPLASTY W/ TURBINOPLASTY Bilateral 07/11/2014   Procedure: NASAL SEPTOPLASTY WITH BILATERAL INFERIOR TURBINATE REDUCTION;  Surgeon: Osborn Coho, MD;  Location: [facility];  Service: ENT;  Laterality: Bilateral;   SINUS ENDO W/FUSION Bilateral 07/11/2014   Procedure: BILATERAL ENDOSCOPIC SINUS SURGERY/RESECTION OF INTRA NASAL POLYPS WITH FUSION SCAN;  Surgeon: Osborn Coho, MD;  Location: [facility];  Service: ENT;  Laterality: Bilateral;    Social History:  Social History   Socioeconomic History   Marital status: Married    Spouse name: [name]   Number of children: Not on file   Years of education: Not on file   Highest education level: Not on file  Occupational History   Not on file  Tobacco Use   Smoking status: Never   Smokeless tobacco: Never  Vaping Use   Vaping Use: Never used  Substance and Sexual Activity   Alcohol use: Yes    Comment: rarely   Drug use: No   Sexual activity: Not on file  Other Topics Concern   Not on file  Social History Narrative   Original from [place]         Social Determinants of Health   Financial Resource Strain: Not on file  Food Insecurity: Not on file  Transportation Needs: Not on file  Physical Activity: Not on file  Stress: Not on file  Social Connections: Not on  file  Intimate Partner Violence: Not on file    Family History:  Family History  Problem Relation Age of Onset   Hypertension Other        M, B   Hypertension Mother     Medications:   Current Outpatient Medications on File Prior to Visit  Medication Sig Dispense Refill   aspirin EC 81 MG EC tablet Take 1 tablet (81 mg total) by mouth daily. Swallow whole. 30 tablet 11   atorvastatin (LIPITOR) 80 MG tablet Take 1 tablet (80 mg total) by mouth daily. 90 tablet 3   ezetimibe (ZETIA) 10 MG tablet Take 1 tablet (10 mg total) by mouth daily. 90 tablet 3   lisinopril-hydrochlorothiazide (ZESTORETIC) 20-12.5 MG tablet Take 1 tablet by mouth daily. 90 tablet 3   No current facility-administered medications on file prior to visit.    Allergies:  No Known Allergies    OBJECTIVE:  Physical Exam  Vitals:   10/27/20 0755  BP: (!) 146/88  Pulse: (!) 56  Weight: 180 lb (81.6 kg)  Height: 5\' 7"  (1.702  m)    Body mass index is 28.19 kg/m. No results found.   General: well developed, well nourished, pleasant elderly male, seated, in no evident distress Head: head normocephalic and atraumatic.   Neck: supple with no carotid or supraclavicular bruits Cardiovascular: regular rate and rhythm, no murmurs Musculoskeletal: no deformity Skin:  no rash/petichiae Vascular:  Normal pulses all extremities   Neurologic Exam Mental Status: Awake and fully alert. Denies dysarthria or aphasia. Primary language of Spanish with some broken [place]. Oriented to place and time. Recent and remote memory intact. Attention span, concentration and fund of knowledge appropriate. Mood and affect appropriate.  Cranial Nerves: Pupils equal, briskly reactive to light. Extraocular movements full without nystagmus. Visual fields full to confrontation. Hearing intact. Facial sensation intact. Face, tongue, palate moves normally and symmetrically.  Motor: Normal bulk and tone. Normal strength in all tested  extremity muscles Sensory.: intact to touch , pinprick , position and vibratory sensation.  Coordination: Rapid alternating movements normal in all extremities. Finger-to-nose and heel-to-shin performed accurately bilaterally. Gait and Station: Arises from chair without difficulty. Stance is normal. Gait demonstrates normal stride length and balance without use of assistive device.  Able to tandem walk and heel toe without difficulty.  Romberg negative Reflexes: 1+ and symmetric. Toes downgoing.         ASSESSMENT: Stephane Niemann is a 60 y.o. 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 Vascular risk factors include HTN, HLD, OSA on CPAP and significant family history of stroke.      PLAN:  Midbrain stroke:  Recovered well without residual deficit Continue aspirin 81 mg daily  and atorvastatin 80 mg daily and Zetia 10 mg daily for secondary stroke prevention.   Discussed secondary stroke prevention measures and importance of close PCP follow up for aggressive stroke risk factor management  HTN: BP goal <130/90.  Slightly elevated today on lisinopril-hydrochlorothiazide (recently started by PCP) - per PCP note, advised to continue amlodipine in addition to lisinopril-hydrochlorothiazide - advised to continue PCP office to clarify - recommend monitoring blood pressure 30 to 60 minutes after taking BP meds or as advised by PCP HLD: LDL goal <70. Recent LDL 118 down from 136 on atorvastatin 80 mg daily therefore Zetia 10 mg daily recently added by PCP -if remains elevated on recheck would recommend consideration of PCSK9 inhibitor OSA on CPAP: previously followed by [facility] neurology - per insurance, he needs to establish care with [facility] health.  Unable to personally review download - will discuss further with our  sleep clinic to possible transfer of care to our office -advised to bring CPAP machine at follow-up visit.  Discussed importance of continued nightly usage.    Follow up in 6 months or call earlier if needed   CC:  [facility] provider: Dr. Torrie Mayers, Bertram Millard, MD    Ihor Austin, [facility]  [facility] [address] [place], [place] [address]  Phone [phone] Fax [phone] Note: This document was prepared with digital dictation and possible smart phrase technology. Any transcriptional errors that result from this process are unintentional.

## 2020-10-31 NOTE — Progress Notes (Signed)
I agree with the above plan 

## 2021-04-24 ENCOUNTER — Encounter: Payer: Self-pay | Admitting: Family Medicine

## 2021-04-24 ENCOUNTER — Ambulatory Visit (INDEPENDENT_AMBULATORY_CARE_PROVIDER_SITE_OTHER): Payer: 59 | Admitting: Family Medicine

## 2021-04-24 ENCOUNTER — Other Ambulatory Visit: Payer: Self-pay

## 2021-04-24 VITALS — BP 162/82 | HR 81 | Temp 97.7°F | Ht 67.0 in | Wt 185.0 lb

## 2021-04-24 DIAGNOSIS — I10 Essential (primary) hypertension: Secondary | ICD-10-CM | POA: Diagnosis not present

## 2021-04-24 DIAGNOSIS — E785 Hyperlipidemia, unspecified: Secondary | ICD-10-CM | POA: Diagnosis not present

## 2021-04-24 DIAGNOSIS — R42 Dizziness and giddiness: Secondary | ICD-10-CM

## 2021-04-24 DIAGNOSIS — Z8673 Personal history of transient ischemic attack (TIA), and cerebral infarction without residual deficits: Secondary | ICD-10-CM | POA: Diagnosis not present

## 2021-04-24 NOTE — Progress Notes (Signed)
[facility] [address] [place] [address] Dept: [phone] Dept Fax: [phone]  Office Visit  Subjective:    Patient ID: Manuel Peters, male    DOB: 05-16-1960, 60 y.o..   MRN: [id number]  Chief Complaint  Patient presents with   Dizziness    Dizziness x 1 week    History of Present Illness:  Patient is in today to discuss recent dizziness. His visit was assisted by Loraine Leriche (medical interpreter).  Manuel Peters notes he had been working on Saturday. The next day, he noted repeated episodes of vertigo associated with bending over and standing back up. He noted similar while doing back exercises where he extended his neck and then straightened back up. He denies any associated nausea or hearing loss. Manuel Peters has a history of an ischemic stroke in Feb. 2022. He was concerned that this dizziness might be associated.  Manuel Peters has a history of hyperlipidemia. At his last visit in June, his LDL cholesterol remained > 100, despite being on atorvastatin 80 mg. I had started him on ezetimibe. He notes he had lab work done through a Electrical engineer in August and his lipids were now low. He decided to stop taking the ezetimibe. He felt like he no longer needed this.  Manuel Peters has a history of hypertension. He is managed on Zestoretic. He states he has been taking his medicine. His wife raises concerns about him binging on alcohol periodically. She has advised him to cut down, but he has not done so.  Past Medical History: Patient Active Problem List   Diagnosis Date Noted   History of arterial ischemic stroke 10/08/2020   Dyslipidemia (high LDL; low HDL) 09/23/2020   Acute ischemic stroke (HCC) 05/29/2020   Sleep apnea    Congenital absence of one kidney 07/26/2019   Uses Spanish as primary spoken language 07/26/2019   Vitiligo 07/26/2019   Deviated septum 07/11/2014   Multiple nasal polyps 07/11/2014   Essential  hypertension 06/20/2014   Allergic rhinitis 06/20/2014   Back pain 07/31/2010   Past Surgical History:  Procedure Laterality Date   COLONOSCOPY  2018   Reportedly normal. Repeat recommended in 10 years.   HERNIA REPAIR  1980s   inguinal    NASAL SEPTOPLASTY W/ TURBINOPLASTY Bilateral 07/11/2014   Procedure: NASAL SEPTOPLASTY WITH BILATERAL INFERIOR TURBINATE REDUCTION;  Surgeon: Osborn Coho, MD;  Location: [facility];  Service: ENT;  Laterality: Bilateral;   SINUS ENDO W/FUSION Bilateral 07/11/2014   Procedure: BILATERAL ENDOSCOPIC SINUS SURGERY/RESECTION OF INTRA NASAL POLYPS WITH FUSION SCAN;  Surgeon: Osborn Coho, MD;  Location:  SUR[facility]ERY [facility];  Service: ENT;  Laterality: Bilateral;   Family History  Problem Relation Age of Onset   Hypertension Other        M, B   Hypertension Mother    Outpatient Medications Prior to Visit  Medication Sig Dispense Refill   aspirin EC 81 MG EC tablet Take 1 tablet (81 mg total) by mouth daily. Swallow whole. 30 tablet 11   atorvastatin (LIPITOR) 80 MG tablet Take 1 tablet (80 mg total) by mouth daily. 90 tablet 3   lisinopril-hydrochlorothiazide (ZESTORETIC) 20-12.5 MG tablet Take 1 tablet by mouth daily. 90 tablet 3   ezetimibe (ZETIA) 10 MG tablet Take 1 tablet (10 mg total) by mouth daily. (Patient not taking: Reported on 04/24/2021) 90 tablet 3   No facility-administered medications prior to visit.   No Known Allergies  Objective:   Today's Vitals   04/24/21 1039  BP: (!) 162/82  Pulse: 81  Temp: 97.7 F (36.5 C)  TempSrc: Temporal  SpO2: 96%  Weight: 185 lb (83.9 kg)  Height: 5\' 7"  (1.702 m)   Body mass index is 28.98 kg/m.   General: Well developed, well nourished. No acute distress. HEENT: Normocephalic, non-traumatic. PERRL, EOMI. Conjunctiva clear. No nystagmus. External ears   normal. EAC and TMs normal bilaterally. Nose clear without congestion or rhinorrhea. Mucous   membranes moist.  Oropharynx clear. Good dentition. Neck: Supple. No lymphadenopathy. No thyromegaly. Lungs: Clear to auscultation bilaterally. No wheezing, rales or rhonchi. CV: RRR without murmurs or rubs. Pulses 2+ bilaterally. Neuro: CN II-XII intact. Normal sensation and DTR bilaterally. Psych: Alert and oriented. Normal mood and affect.  Health Maintenance Due  Topic Date Due   Hepatitis C Screening  Never done   COLONOSCOPY (Pts 45-6yrs Insurance coverage will need to be confirmed)  Never done   Zoster Vaccines- Shingrix (1 of 2) Never done     Lab Results: (12/29/2020) CBC: Normal HbA1c: 5.6% CMP: Normal, except for total bilirubin = 1.8 mg/dL Lipids:  Total Cholesterol: 110 mg/dL  HDL Cholesterol: 12/31/2020 mg/dL  LDL Cholesterol: 49 mg/dL  VLDL Cholesterol:  29.9 mg/dL  Triglycerides:  68 mg/dL TSH: 24.2 uIU/mL  Assessment & Plan:   1. Vertigo The symptoms are suggestive of BPPV. As this has been resolved for several days, there is no need for intervention, Should this recur and be persistent, Manuel Peters should follow up.  2. Essential hypertension Manuel Peters's BP was elevated today. I repeated this and got similar pressures. He has a blood pressure cuff at home, but isn't sure if this is working. I did advise him to moderate his alcohol intake, avoiding drinking more than 4 oz. of liquor a day. I will recheck his blood pressure in 2 weeks. If this remains high, we will adjust his medications.  3. Dyslipidemia (high LDL; low HDL) Manuel Peters lipids were in good control in Aug. I explained that this would not be a permanent change, but was likely due to the effective combination of atorvastatin and ezetimibe. I will recheck his fasting lipids and we will follow-up in regards to whether he needs to have the ezetimibe added back.  - Lipid panel; Future  4. History of arterial ischemic stroke We discussed high risk for future strokes without optimal blood pressure and lipid  management.  Sep, MD

## 2021-05-11 ENCOUNTER — Telehealth: Payer: Self-pay | Admitting: Family Medicine

## 2021-05-11 ENCOUNTER — Other Ambulatory Visit: Payer: Self-pay

## 2021-05-11 ENCOUNTER — Other Ambulatory Visit (INDEPENDENT_AMBULATORY_CARE_PROVIDER_SITE_OTHER): Payer: 59

## 2021-05-11 DIAGNOSIS — E785 Hyperlipidemia, unspecified: Secondary | ICD-10-CM

## 2021-05-11 LAB — LIPID PANEL
Cholesterol: 94 mg/dL (ref 0–200)
HDL: 40.9 mg/dL (ref >39.00)
LDL Cholesterol: 41 mg/dL (ref 0–99)
NonHDL: 53.31
Total CHOL/HDL Ratio: 2
Triglycerides: 63 mg/dL (ref 0.0–149.0)
VLDL: 12.6 mg/dL (ref 0.0–40.0)

## 2021-05-11 NOTE — Progress Notes (Signed)
Per Dr.Rudd's orders pt is here for labs, pt tolerated draw well.

## 2021-05-12 NOTE — Telephone Encounter (Signed)
Lft VM to rtn call on daughers, Peter Congo, number. Dm/cma

## 2021-05-12 NOTE — Telephone Encounter (Signed)
Spoke to [place], pt's daughter, she states that she will just keep the upcoming appointment on 05/18/21 @ 4:00.   She did report that his BP is elevated and the only thing keeping it down to 140/80's is taking 2 tablets of Zestoretic daily.    Dm/cma

## 2021-05-15 ENCOUNTER — Other Ambulatory Visit: Payer: Self-pay

## 2021-05-18 ENCOUNTER — Encounter: Payer: Self-pay | Admitting: Adult Health

## 2021-05-18 ENCOUNTER — Other Ambulatory Visit: Payer: Self-pay

## 2021-05-18 ENCOUNTER — Encounter: Payer: Self-pay | Admitting: Family Medicine

## 2021-05-18 ENCOUNTER — Ambulatory Visit (INDEPENDENT_AMBULATORY_CARE_PROVIDER_SITE_OTHER): Payer: 59 | Admitting: Family Medicine

## 2021-05-18 VITALS — BP 132/80 | HR 74 | Temp 97.8°F | Ht 67.0 in | Wt 187.2 lb

## 2021-05-18 DIAGNOSIS — I10 Essential (primary) hypertension: Secondary | ICD-10-CM

## 2021-05-18 DIAGNOSIS — E785 Hyperlipidemia, unspecified: Secondary | ICD-10-CM

## 2021-05-18 MED ORDER — LISINOPRIL-HYDROCHLOROTHIAZIDE 20-12.5 MG PO TABS: 2.0000 | ORAL_TABLET | Freq: Every day | ORAL | 3 refills | Status: DC

## 2021-05-18 NOTE — Progress Notes (Signed)
[facility] [address] [place] [address] Dept: [phone] Dept Fax: [phone]  Chronic Care Office Visit  Subjective:    Patient ID: Manuel Peters, male    DOB: August 08, 1960, 61 y.o..   MRN: [id number]  Chief Complaint  Patient presents with   Follow-up    3 week f/u HTN.  Average BP @home  161/86 - 138/80.  Been taking 2 tabs Lisinoprril/HCTZ.      History of Present Illness:  Patient is in today for reassessment of chronic medical issues.  Mr. Ressel has a history of hyperlipidemia. In June 2022, his LDL cholesterol remained > 100, despite being on atorvastatin 80 mg. I had started him on ezetimibe. He notes he had lab work done through a 07-06-1991 in August and his lipids were now low. He decided to stop taking the ezetimibe. He felt like he no longer needed this. He did come for fasting lipids on 1/9.   Mr. Zaring has a history of hypertension. He is managed on Zestoretic. He has cur out his alcohol use. His wife had been monitoring his BP at home and continued to see values of 160/86. She doubled his Zestoretic dose and now see his blood pressure as 138/80. Mr. Allman states he is tolerating this well and denies any lightheadedness.  Past Medical History: Patient Active Problem List   Diagnosis Date Noted   History of arterial ischemic stroke 10/08/2020   Dyslipidemia (high LDL; low HDL) 09/23/2020   Acute ischemic stroke (HCC) 05/29/2020   Sleep apnea    Congenital absence of one kidney 07/26/2019   Uses Spanish as primary spoken language 07/26/2019   Vitiligo 07/26/2019   Deviated septum 07/11/2014   Multiple nasal polyps 07/11/2014   Essential hypertension 06/20/2014   Allergic rhinitis 06/20/2014   Back pain 07/31/2010   Past Surgical History:  Procedure Laterality Date   COLONOSCOPY  2018   Reportedly normal. Repeat recommended in 10 years.   HERNIA REPAIR  1980s   inguinal    NASAL  SEPTOPLASTY W/ TURBINOPLASTY Bilateral 07/11/2014   Procedure: NASAL SEPTOPLASTY WITH BILATERAL INFERIOR TURBINATE REDUCTION;  Surgeon: 09/10/2014, MD;  Location: [facility];  Service: ENT;  Laterality: Bilateral;   SINUS ENDO W/FUSION Bilateral 07/11/2014   Procedure: BILATERAL ENDOSCOPIC SINUS SURGERY/RESECTION OF INTRA NASAL POLYPS WITH FUSION SCAN;  Surgeon: 09/10/2014, MD;  Location: [facility];  Service: ENT;  Laterality: Bilateral;   Family History  Problem Relation Age of Onset   Hypertension Other        M, B   Hypertension Mother    Outpatient Medications Prior to Visit  Medication Sig Dispense Refill   aspirin EC 81 MG EC tablet Take 1 tablet (81 mg total) by mouth daily. Swallow whole. 30 tablet 11   atorvastatin (LIPITOR) 80 MG tablet Take 1 tablet (80 mg total) by mouth daily. 90 tablet 3   ezetimibe (ZETIA) 10 MG tablet Take 1 tablet (10 mg total) by mouth daily. 90 tablet 3   lisinopril-hydrochlorothiazide (ZESTORETIC) 20-12.5 MG tablet Take 1 tablet by mouth daily. 90 tablet 3   No facility-administered medications prior to visit.   No Known Allergies    Objective:   Today's Vitals   05/18/21 1601  BP: 132/80  Pulse: 74  Temp: 97.8 F (36.6 C)  TempSrc: Temporal  SpO2: 96%  Weight: 187 lb 3.2 oz (84.9 kg)  Height: 5\' 7"  (1.702 m)   Body  mass index is 29.32 kg/m.   General: Well developed, well nourished. No acute distress. Psych: Alert and oriented. Normal mood and affect.  Health Maintenance Due  Topic Date Due   Hepatitis C Screening  Never done   COLONOSCOPY (Pts 45-47yrs Insurance coverage will need to be confirmed)  Never done   Zoster Vaccines- Shingrix (1 of 2) Never done   Lab Results Last lipids Lab Results  Component Value Date   CHOL 94 05/11/2021   HDL 40.90 05/11/2021   LDLCALC 41 05/11/2021   TRIG 63.0 05/11/2021   CHOLHDL 2 05/11/2021   Assessment & Plan:   1. Essential hypertension Blood  pressure is improved now on the higher dose of Zestoretic. I will plan to continue this, but do want to check his renal studies and electrolytes to make sure he is tolerating this.  - lisinopril-hydrochlorothiazide (ZESTORETIC) 20-12.5 MG tablet; Take 2 tablets by mouth daily.  Dispense: 180 tablet; Refill: 3 - Basic metabolic panel  2. Dyslipidemia (high LDL; low HDL) At goal. I feel he can leave the Zetia off for now and we will reassess this in 6 months.  Loyola Mast, MD

## 2021-05-19 ENCOUNTER — Encounter: Payer: Self-pay | Admitting: Adult Health

## 2021-05-19 ENCOUNTER — Telehealth: Payer: Self-pay

## 2021-05-19 ENCOUNTER — Ambulatory Visit: Payer: 59 | Admitting: Adult Health

## 2021-05-19 ENCOUNTER — Telehealth: Payer: Self-pay | Admitting: Adult Health

## 2021-05-19 VITALS — BP 154/76 | HR 65 | Ht 67.0 in | Wt 180.0 lb

## 2021-05-19 DIAGNOSIS — R2689 Other abnormalities of gait and mobility: Secondary | ICD-10-CM

## 2021-05-19 DIAGNOSIS — G4733 Obstructive sleep apnea (adult) (pediatric): Secondary | ICD-10-CM | POA: Diagnosis not present

## 2021-05-19 DIAGNOSIS — R29818 Other symptoms and signs involving the nervous system: Secondary | ICD-10-CM

## 2021-05-19 DIAGNOSIS — Z9989 Dependence on other enabling machines and devices: Secondary | ICD-10-CM

## 2021-05-19 DIAGNOSIS — I639 Cerebral infarction, unspecified: Secondary | ICD-10-CM

## 2021-05-19 DIAGNOSIS — I69398 Other sequelae of cerebral infarction: Secondary | ICD-10-CM | POA: Diagnosis not present

## 2021-05-19 LAB — BASIC METABOLIC PANEL
BUN: 20 mg/dL (ref 6–23)
CO2: 31 mEq/L (ref 19–32)
Calcium: 9.3 mg/dL (ref 8.4–10.5)
Chloride: 99 mEq/L (ref 96–112)
Creatinine, Ser: 0.95 mg/dL (ref 0.40–1.50)
GFR: 86.02 mL/min (ref >60.00)
Glucose, Bld: 97 mg/dL (ref 70–99)
Potassium: 4 mEq/L (ref 3.5–5.1)
Sodium: 136 mEq/L (ref 135–145)

## 2021-05-19 NOTE — Patient Instructions (Signed)
You will be called to schedule a repeat MRI of your brain to ensure no new stroke with worsening imbalance  Continue aspirin 81 mg daily  and atorvastatin for secondary stroke prevention  Continue to follow up with PCP regarding cholesterol and blood pressure management  Maintain strict control of hypertension with blood pressure goal below 130/90 and cholesterol with LDL cholesterol (bad cholesterol) goal below 70 mg/dL.   Signs of a Stroke? Follow the BEFAST method:  Balance Watch for a sudden loss of balance, trouble with coordination or vertigo Eyes Is there a sudden loss of vision in one or both eyes? Or double vision?  Face: Ask the person to smile. Does one side of the face droop or is it numb?  Arms: Ask the person to raise both arms. Does one arm drift downward? Is there weakness or numbness of a leg? Speech: Ask the person to repeat a simple phrase. Does the speech sound slurred/strange? Is the person confused ? Time: If you observe any of these signs, call 911.  Please let us know where you receive your CPAP supplies and we will send updated orders     Followup in the future with me in 6 months or call earlier if needed       Thank you for coming to see [place] at [facility]. I hope we have been able to provide you high quality care today.  You may receive a patient satisfaction survey over the next few weeks. We would appreciate your feedback and comments so that we may continue to improve ourselves and the health of our patients.

## 2021-05-19 NOTE — Telephone Encounter (Signed)
[employer] pending faxed notes  °

## 2021-05-19 NOTE — Telephone Encounter (Signed)
Pt was previously managed by [facility] for OSA, transferred to [place] due to insurance. Pt has RESMED device, card DL

## 2021-05-19 NOTE — Progress Notes (Signed)
[facility] [address] [place]. [place] [address] [phone]       STROKE FOLLOW UP NOTE  [name] Date of Birth:  1960/06/22 Medical Record Number:  [id number]   Reason for Referral: stroke follow up    SUBJECTIVE:   CHIEF COMPLAINT:  Chief Complaint  Patient presents with   Follow-up    RM 3 with spouse Manuel Peters (& has [facility] interpreter) Pt is well and stable, no new concerns      HPI:   Update 05/19/2021 JM: Returns for stroke follow-up after prior visit 7 months ago accompanied by his wife and [facility] interpreter. Per wife, he has had imbalance since stroke but worsening over the past 5 months. At prior visits, he denies any residual stroke deficits. She reports at times he will lean towards the side while ambulating but unable to say if one specific side.  Pt denies having any issues with imbalance or ambulation.  Denies any falls.  Denies any other associated stroke/TIA symptoms.  Continues to work doing Sales executive and denies any difficulties with this.  Denies any new medications around the time of worsening.  Has been trying to eat healthy.  Drinks 30-40 oz of water per day. Also reports 2-3 months ago, episode of dizziness eval by PCP who felt consistent with BPPV and no recurrence since that time (although per epic review, saw PCP on 12/23 reporting episode occurred on 12/18).  Compliant on aspirin and atorvastatin without side effects.  Previously on Zetia but self discontinued as he did not feel this was needed.  Recent lipid panel showed LDL 41 down from 118.  Blood pressure today 154/76.    Reports compliance on CPAP.  Previously followed by [facility] neurology due to insurance reasons, he requested transfer to our office for ongoing monitoring and management. He brough CPAP today with chip for download.  CPAP compliance report from 04/19/2021 -05/18/2021 shows 29 out of 30 usage days for 97% compliance and 83% compliance for greater  than 4 hours usage.  Average usage 5 hours and 39 minutes.  Residual AHI 0.7 with mean pressure 8 and max pressure 13 with EPR level 2.  Pressure in the 95th percentile 11.7.  Leaks in the 95th percentile 24.1. No CPAP related concerns -tolerating well.  No further concerns at this time     History provided for reference purposes only Update 10/27/2020 JM: Manuel Peters returns for 32-month stroke follow-up accompanied by [facility] interpreter.  He has been doing well since prior visit reporting complete recovery of prior deficits and denies new stroke/TIA symptoms.  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  Compliant on aspirin and atorvastatin 80 mg daily without associated side effects.  Recent lipid panel by PCP showed continued elevated LDL 118 therefore added Zetia 10 mg daily.  Blood pressure today 146/88 on lisinopril-hydrochlorothiazide per PCP. Monitors at home and typically stable - does report occasional elevated levels at home but typically checks BP in the AM prior to taking his BP medication -usually stable during. Use of CPAP nightly.  No further concerns at this time.  Initial visit 06/26/2020 JM: Manuel Peters is being seen for hospital follow-up accompanied by his daughter who assists with interpretation.  Residual imbalance more so towards the left side (or leaning towards left) but overall improvement since discharge He will occasionally have vertigo when laying in bed but this has greatly improvement.  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 male with medical history significant for hypertension and OSA on CPAP, and recently seen by rheumatology for positive ANA.  He presented to [facility] ED on 05/28/2020 for acute onset of dizziness associated with nausea and vomiting and found to have significant nystagmus and truncal lean upon exam as well as Covid positive.  Personally reviewed hospitalization pertinent progress notes, lab work and imaging with summary provided.  Evaluated by Dr. 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  Recommended DAPT for 3 weeks and aspirin alone.  LDL 136 and initiate atorvastatin 80 mg daily.  Other stroke risk factors include HTN, asymptomatic COVID infection, significant family history of stroke (mother and sibling died from stroke and remaining brother significant paralysis s/p stroke) and OSA on CPAP.  No personal prior history of stroke.  Evaluated by therapies and recommended OP PT/OT discharged home in stable condition.    Stroke - Midbrain stroke likely related to SVD Code Stroke CT ASPECTS 10.  CTA head & neck: Normal head CT.  No acute intracranial abnormality identified.  MRI: Subtle 8 mm ill-defined diffusion signal abnormality involving the central aspect of the midbrain, suspicious for possible acute small vessel ischemia given the patient's history. No associated hemorrhage or mass effect. Carotid Doppler unremarkable 2D Echo EF 60-65% LDL  136 HgbA1c 5.6 Covid testing positive UDS negative VTE prophylaxis is recommended.  Passed bedside swallow eval. On heart healthy diet.  Recommend aspirin and Plavix DAPT for 3 weeks and then aspirin alone. Therapy  recommendations:  CIR --> OP PT/OT Disposition:  home    ROS:   14 system review of systems performed and negative with exception of those listed in HPI  PMH:  Past Medical History:  Diagnosis Date   Elevated BP    Hypertension    Sinus congestion    Sleep apnea    does not use a cpap   Stroke (HCC)     PSH:  Past Surgical History:  Procedure Laterality Date   COLONOSCOPY  2018   Reportedly normal. Repeat recommended in 10 years.   HERNIA REPAIR  1980s   inguinal    NASAL SEPTOPLASTY W/ TURBINOPLASTY Bilateral 07/11/2014   Procedure: NASAL SEPTOPLASTY WITH BILATERAL INFERIOR TURBINATE REDUCTION;  Surgeon: Manuel Belfast, MD;  Location: [place];  Service: ENT;  Laterality: Bilateral;   SINUS ENDO W/FUSION Bilateral 07/11/2014   Procedure: BILATERAL ENDOSCOPIC SINUS SURGERY/RESECTION OF INTRA NASAL POLYPS WITH FUSION SCAN;  Surgeon: Manuel Belfast, MD;  Location: [place];  Service: ENT;  Laterality: Bilateral;    Social History:  Social History   Socioeconomic History   Marital status: Married    Spouse name: Manuel Peters   Number of children: Not on file   Years of education: Not on file  Highest education level: Not on file  Occupational History   Not on file  Tobacco Use   Smoking status: Never   Smokeless tobacco: Never  Vaping Use   Vaping Use: Never used  Substance and Sexual Activity   Alcohol use: Yes    Comment: rarely   Drug use: No   Sexual activity: Not on file  Other Topics Concern   Not on file  Social History Narrative   Original from [place]         Social Determinants of Health   Financial Resource Strain: Not on file  Food Insecurity: Not on file  Transportation Needs: Not on file  Physical Activity: Not on file  Stress: Not on file  Social Connections: Not on file  Intimate Partner Violence: Not on file    Family History:  Family History  Problem Relation Age of Onset   Hypertension  Other        M, B   Hypertension Mother     Medications:   Current Outpatient Medications on File Prior to Visit  Medication Sig Dispense Refill   aspirin EC 81 MG EC tablet Take 1 tablet (81 mg total) by mouth daily. Swallow whole. 30 tablet 11   atorvastatin (LIPITOR) 80 MG tablet Take 1 tablet (80 mg total) by mouth daily. 90 tablet 3   lisinopril-hydrochlorothiazide (ZESTORETIC) 20-12.5 MG tablet Take 2 tablets by mouth daily. 180 tablet 3   No current facility-administered medications on file prior to visit.    Allergies:  No Known Allergies    OBJECTIVE:  Physical Exam  Vitals:   05/19/21 0753  BP: (!) 154/76  Pulse: 65  Weight: 180 lb (81.6 kg)  Height: 5\' 7"  (1.702 m)   Body mass index is 28.19 kg/m. No results found.  General: well developed, well nourished, pleasant elderly male, seated, in no evident distress Head: head normocephalic and atraumatic.   Neck: supple with no carotid or supraclavicular bruits Cardiovascular: regular rate and rhythm, no murmurs Musculoskeletal: no deformity Skin:  no rash/petichiae Vascular:  Normal pulses all extremities   Neurologic Exam Mental Status: Awake and fully alert. Denies dysarthria or aphasia. Primary language of Spanish with some broken [place]. Oriented to place and time. Recent and remote memory intact. Attention span, concentration and fund of knowledge appropriate. Mood and affect appropriate.  Cranial Nerves: Pupils equal, briskly reactive to light. Extraocular movements full without nystagmus. Visual fields full to confrontation. Hearing intact. Facial sensation intact. Face, tongue, palate moves normally and symmetrically.  Motor: Normal bulk and tone. Normal strength in all tested extremity muscles Sensory.: intact to touch , pinprick , position and vibratory sensation.  Coordination: Rapid alternating movements normal in all extremities. Finger-to-nose and heel-to-shin performed accurately bilaterally. Gait  and Station: Arises from chair without difficulty. Stance is normal. Gait demonstrates normal stride length and balance without use of assistive device.  Difficulty performing tandem walk and mild difficulty performing heel toe.  Romberg slightly positive.  Reflexes: 1+ and symmetric. Toes downgoing.         ASSESSMENT: Manuel Peters is a 61 y.o. 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 Vascular risk factors include HTN, HLD, OSA on CPAP and significant family history of stroke.      PLAN:  Midbrain stroke:  Per wife, persistent imbalance since stroke  although patient previously denied residual deficits and no imbalance noted on prior exam. Wife reports worsening imbalance over the past 5 months. Evidence of imbalance noted on exam. No other new neurological symptoms or concerning findings on exam.  Recommend repeating MRI brain to ensure no new stroke. Declined PT. Discussed fall prevention Continue aspirin 81 mg daily  and atorvastatin 80 mg daily for secondary stroke prevention.   Discussed secondary stroke prevention measures and importance of close PCP follow up for aggressive stroke risk factor management  HTN: BP goal <130/90.  Stable on current regimen managed by PCP HLD: LDL goal <70. Recent LDL 41 down from 118 on atorvastatin 80 mg daily managed and monitored by PCP OSA on CPAP: previously followed by [place] neurology with HST 10/2019 - per insurance, he needs to establish care with [facility] health and requests transfer of care to our office. Review of CPAP download shows satisfactory compliance with optimal residual AHI.  Continue current settings. Wife will call with information regarding DME company to obtain ongoing supplies. Order will be placed at that time.     Follow up in 6 months or  call earlier if needed   CC:  Rudd, Lillette Boxer, MD    I spent 43 minutes of face-to-face and non-face-to-face time with patient and wife.  This included previsit chart review, lab review, study review, order entry, electronic health record documentation, patient and wife education and discussion regarding history of prior stroke and secondary stroke prevention and aggressive stroke risk factor management, worsening imbalance concerns, OSA on CPAP with review and discussion of download report and answered all other questions to patient satisfaction   Frann Rider, [facility]  [facility] [address] [place], [place] [address]  Phone [phone] Fax [phone] Note: This document was prepared with digital dictation and possible smart phrase technology. Any transcriptional errors that result from this process are unintentional.

## 2021-05-19 NOTE — Telephone Encounter (Signed)
Pt wife left a vm to call back, phone rep spoke with wife and she gave the company name as : [facility] [employer] [phone], she also gave an alt # of [phone]

## 2021-05-20 NOTE — Telephone Encounter (Signed)
[employer] auth: [id number] (exp. 05/19/21 to 08/17/21) order sent to [employer], they will reach out to the patient to schedule.

## 2021-05-28 ENCOUNTER — Other Ambulatory Visit: Payer: Self-pay

## 2021-05-28 ENCOUNTER — Ambulatory Visit (HOSPITAL_COMMUNITY)
Admission: RE | Admit: 2021-05-28 | Discharge: 2021-05-28 | Disposition: A | Discharge status: Home / Self Care | Payer: 59 | Source: Ambulatory Visit | Attending: Adult Health | Admitting: Adult Health

## 2021-05-28 DIAGNOSIS — R2689 Other abnormalities of gait and mobility: Secondary | ICD-10-CM | POA: Diagnosis present

## 2021-05-28 DIAGNOSIS — R29818 Other symptoms and signs involving the nervous system: Secondary | ICD-10-CM | POA: Insufficient documentation

## 2021-05-28 DIAGNOSIS — I69398 Other sequelae of cerebral infarction: Secondary | ICD-10-CM | POA: Insufficient documentation

## 2021-05-28 IMAGING — MR MR HEAD W/O CM
9 of 10 series · 36 of 48 positions shown · non-contrast
Comparison: [DATE]

CLINICAL DATA: Stroke, follow up worsening imbalance - prior stroke
hx

EXAM:
MRI HEAD WITHOUT CONTRAST
TECHNIQUE: Multiplanar, multiecho pulse sequences of the brain and surrounding
structures were obtained without intravenous contrast.

[Series 4: DWI · axial · 3.0mm · 1.09mm/px · z∈[-115,+40]mm · 8 of 106 slices shown (1 of 4)]
[im 1/106]
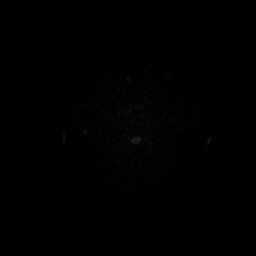
[im 12/106]
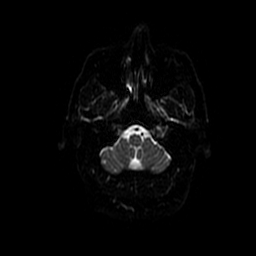
[im 36/106]
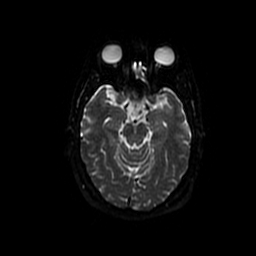
[im 47/106]
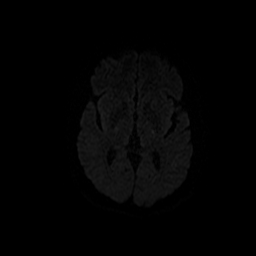
[im 59/106]
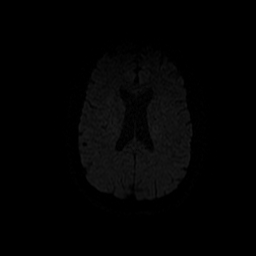
[im 71/106]
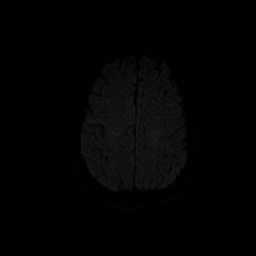
[im 94/106]
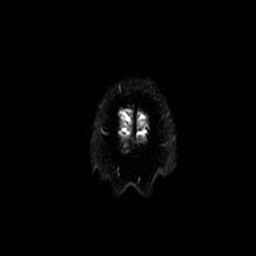
[im 106/106]
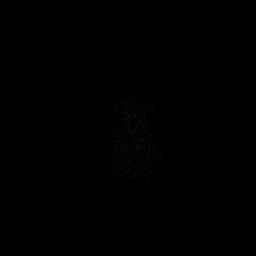

[Series 5: DWI · coronal · 5.0mm · 1.09mm/px · 7 of 78 slices shown (2 of 4)]
[im 1/78]
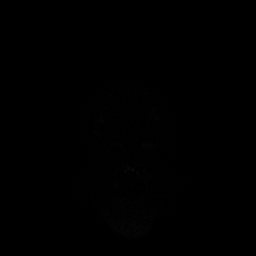
[im 13/78]
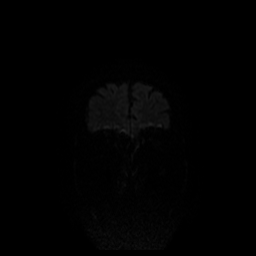
[im 26/78]
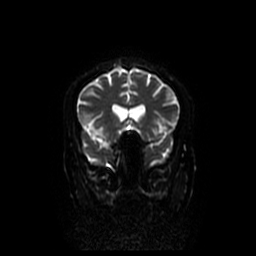
[im 39/78]
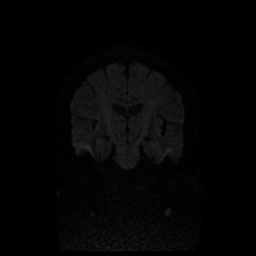
[im 52/78]
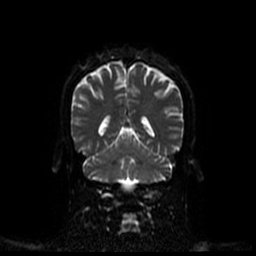
[im 65/78]
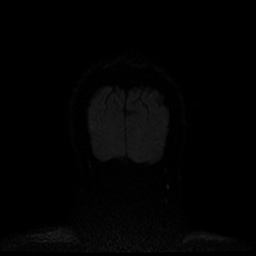
[im 78/78]
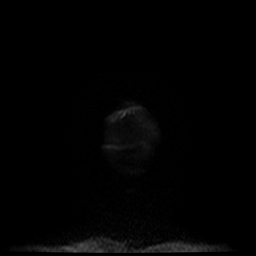

[Series 6: T1 · sagittal · 5.0mm · 0.47mm/px · 3 of 27 slices shown (1 of 2)]
[im 1/27]
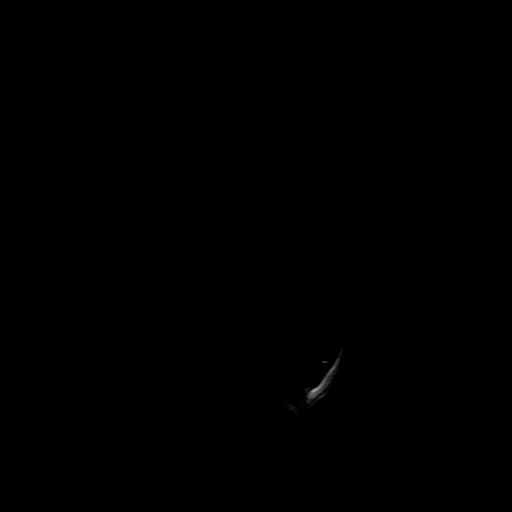
[im 14/27]
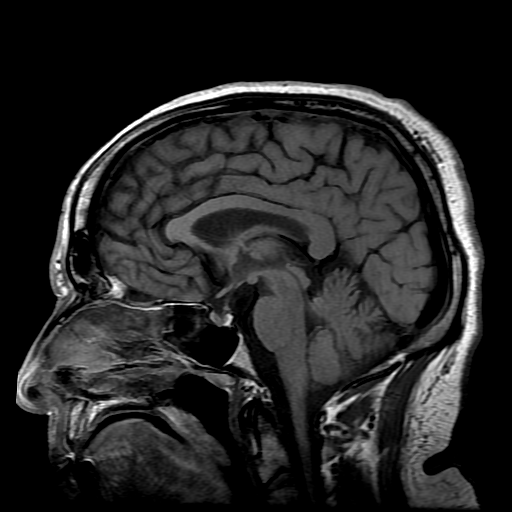
[im 27/27]
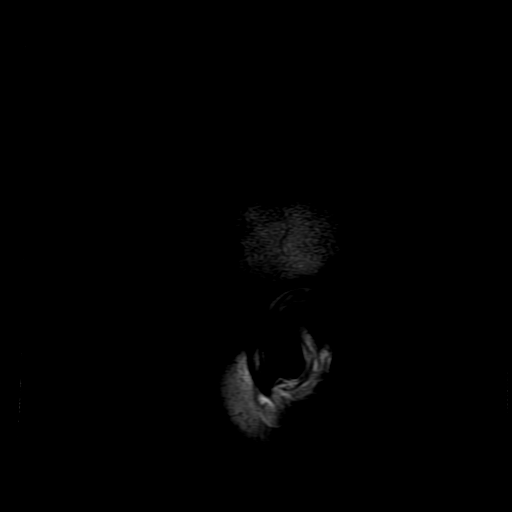

[Series 7: T2 · axial · 5.0mm · 0.43mm/px · z∈[-112,+43]mm · 3 of 27 slices shown (1 of 2)]
[im 1/27]
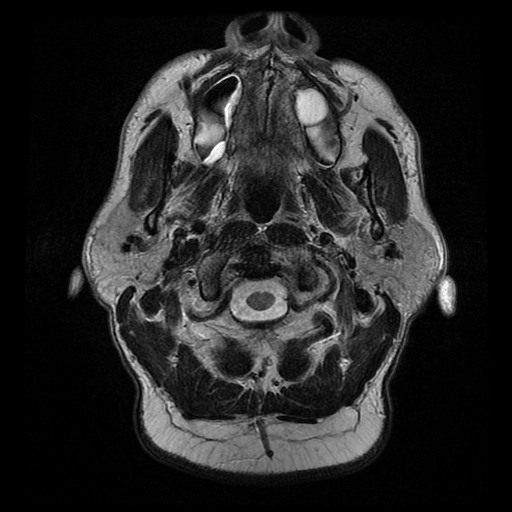
[im 14/27]
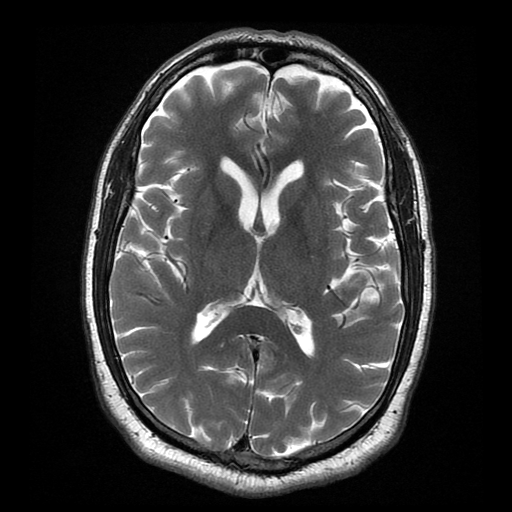
[im 27/27]
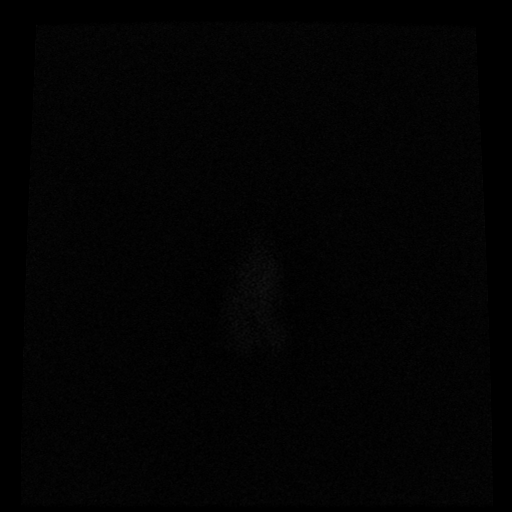

[Series 8: FLAIR · axial · 3.0mm · 0.43mm/px · z∈[-115,+34]mm · 2 of 26 slices shown]
[im 1/26]
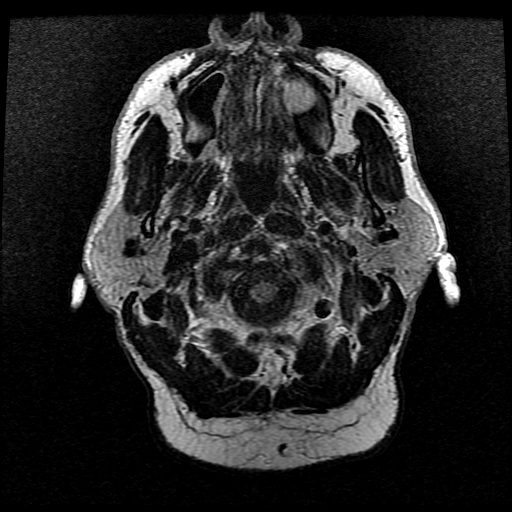
[im 26/26]
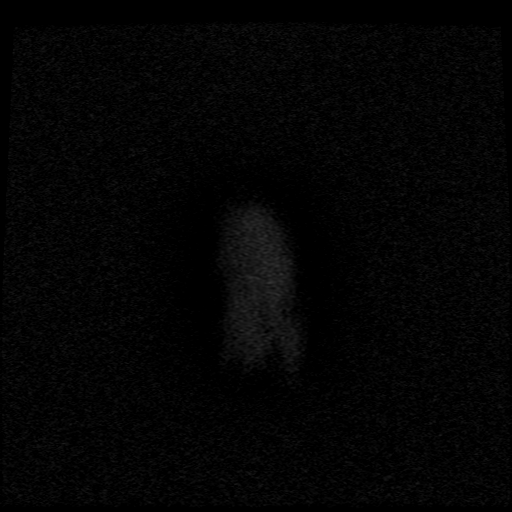

[Series 10: T1 · axial · 3.0mm · 0.47mm/px · 1 of 100 slices shown (2 of 2)]
[im 1/100]
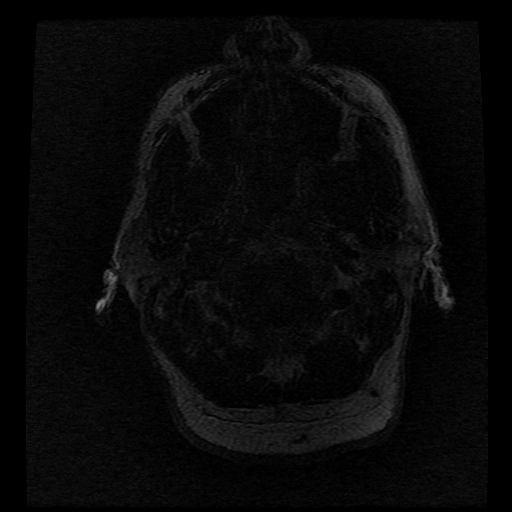

[Series 11: T2 · coronal · 5.0mm · 0.39mm/px · 3 of 30 slices shown (2 of 2)]
[im 1/30]
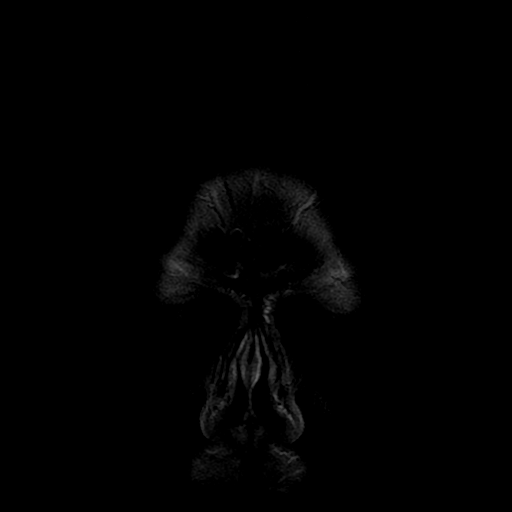
[im 15/30]
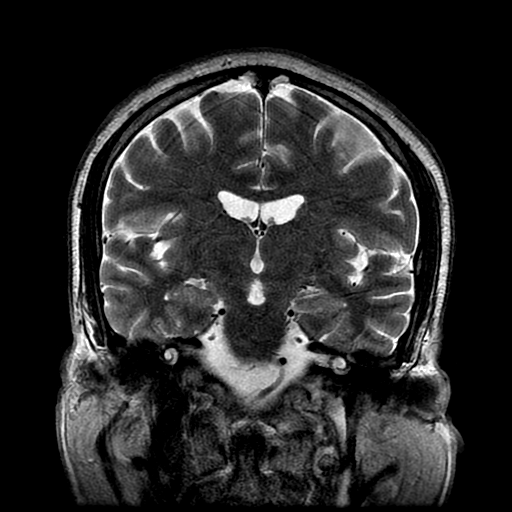
[im 30/30]
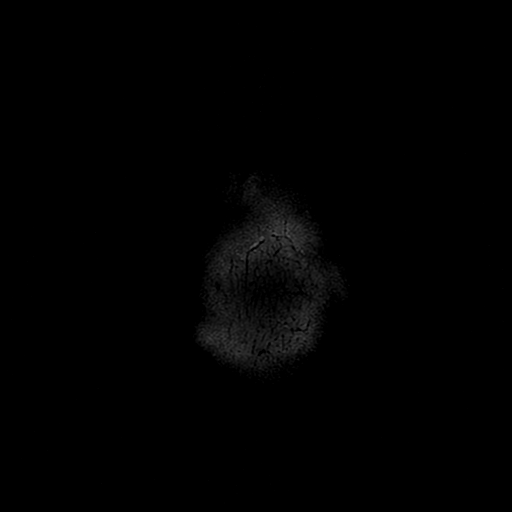

[Series 400: DWI · axial · 3.0mm · 1.09mm/px · z∈[-115,+37]mm · 5 of 52 slices shown (3 of 4)]
[im 1/52]
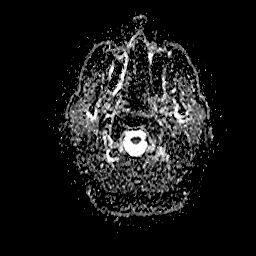
[im 13/52]
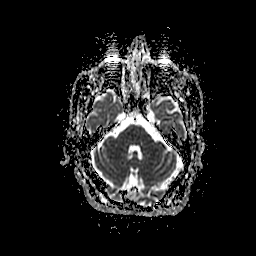
[im 26/52]
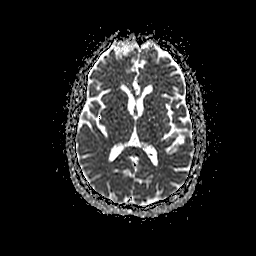
[im 39/52]
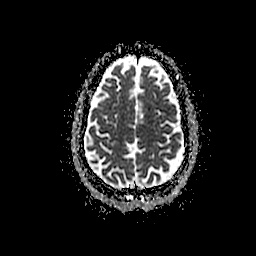
[im 52/52]
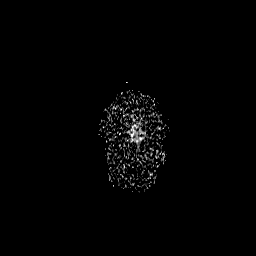

[Series 500: DWI · coronal · 5.0mm · 1.09mm/px · 4 of 39 slices shown (4 of 4)]
[im 1/39]
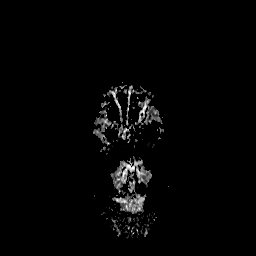
[im 13/39]
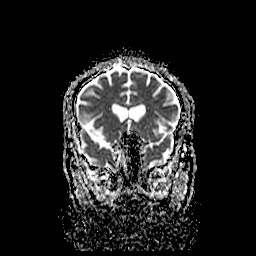
[im 26/39]
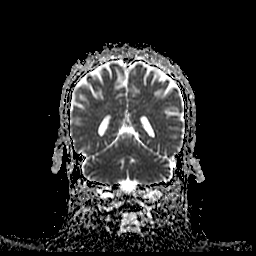
[im 39/39]
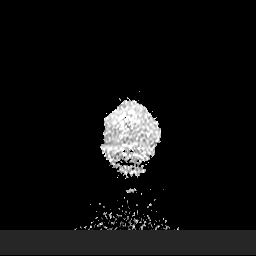

[36 of 48 positions shown; findings below may reference images not displayed]

FINDINGS: Brain: There is no acute infarction or intracranial hemorrhage.
Subtle diffusion hyperintensity is again identified in central
midbrain and unlikely represent infarction as question on the [MX]
study. There is no intracranial mass, mass effect, or edema. There
is no hydrocephalus or extra-axial fluid collection. Ventricles and
sulci are stable in size and configuration.

Vascular: Major vessel flow voids at the skull base are preserved.

Skull and upper cervical spine: Normal marrow signal is preserved.

Sinuses/Orbits: Similar paranasal sinus mucosal thickening. Orbits
are unremarkable.

Other: Sella is unremarkable.  Mastoid air cells are clear.
IMPRESSION: No evidence of recent infarction, hemorrhage, or mass.

## 2021-06-04 ENCOUNTER — Telehealth: Payer: Self-pay | Admitting: Adult Health

## 2021-06-04 NOTE — Telephone Encounter (Signed)
Pt's daughter has called to report [place] is [place].  They have received notification that the MRI results are available.  Neither pt or family is able to get into pt's my chart account.  Daughter is asking for a call with results because there is a concern for pt's high blood pressure.   Daughter is asking for a call with results to MRI so she can relay results to pt

## 2021-06-04 NOTE — Telephone Encounter (Signed)
Contacted daughter back, informed her  recent MR brain looked good without evidence of new strokes. If balance continues to be a concern, would recommend proceeding with PT. She will discuss PT when [place] the [place].  MyChart password reset per rq of daughter and username provided for login. She was appreciative

## 2021-07-22 ENCOUNTER — Encounter: Payer: Self-pay | Admitting: Family Medicine

## 2021-07-22 ENCOUNTER — Ambulatory Visit (INDEPENDENT_AMBULATORY_CARE_PROVIDER_SITE_OTHER): Payer: 59 | Admitting: Family Medicine

## 2021-07-22 ENCOUNTER — Other Ambulatory Visit: Payer: Self-pay

## 2021-07-22 VITALS — BP 140/90 | HR 80 | Temp 97.9°F | Ht 67.0 in | Wt 183.4 lb

## 2021-07-22 DIAGNOSIS — I10 Essential (primary) hypertension: Secondary | ICD-10-CM | POA: Diagnosis not present

## 2021-07-22 MED ORDER — AMLODIPINE BESYLATE-VALSARTAN 10-320 MG PO TABS: 1.0000 | ORAL_TABLET | Freq: Every day | ORAL | 3 refills | Status: DC

## 2021-07-22 NOTE — Progress Notes (Signed)
?[facility] ?[place] [address] ?Dept: [phone] ?Dept Fax: [phone] ? ?Office Visit ? ?Subjective:  ? ? Patient ID: Manuel Peters, male    DOB: Sep 24, 1960, 61 y.o..   MRN: [id number] ? ?Chief Complaint  ?Patient presents with  ? Hypertension  ?  Pt wants to discuss BP medication.  ? ? ?History of Present Illness: ? ?Patient is in today for discussion of his blood pressure medications. Susa Day, medical interpreter, assisted with today's appointment. ? ? Mr. Bareford has a history of hypertension and a previous stroke. he had been being managed on lisinopril-HCTZ. He notes he recently went for a visit to the [place] While there, he saw a cardiologist who performed blood tests. She switched his blood pressure medicine to a combination product of irbesartan 300 mg-amlodpine 10 mg.  Apparently she did a 24-hr blood pressure monitor that showed that the new medication was working well. Mr. Pfohl has a home blood pressure machine, but left this in the DR. ? ?Past Medical History: ?Patient Active Problem List  ? Diagnosis Date Noted  ? History of arterial ischemic stroke 10/08/2020  ? Dyslipidemia (high LDL; low HDL) 09/23/2020  ? Acute ischemic stroke ([place]) 05/29/2020  ? Sleep apnea   ? Congenital absence of one kidney 07/26/2019  ? Uses Spanish as primary spoken language 07/26/2019  ? Vitiligo 07/26/2019  ? Deviated septum 07/11/2014  ? Multiple nasal polyps 07/11/2014  ? Essential hypertension 06/20/2014  ? Allergic rhinitis 06/20/2014  ? Back pain 07/31/2010  ? ?Past Surgical History:  ?Procedure Laterality Date  ? COLONOSCOPY  2018  ? Reportedly normal. Repeat recommended in 10 years.  ? HERNIA REPAIR  1980s  ? inguinal   ? NASAL SEPTOPLASTY W/ TURBINOPLASTY Bilateral 07/11/2014  ? Procedure: NASAL SEPTOPLASTY WITH BILATERAL INFERIOR TURBINATE REDUCTION;  Surgeon: Jerrell Belfast, MD;  Location: [facility];   Service: ENT;  Laterality: Bilateral;  ? SINUS ENDO W/FUSION Bilateral 07/11/2014  ? Procedure: BILATERAL ENDOSCOPIC SINUS SURGERY/RESECTION OF INTRA NASAL POLYPS WITH FUSION SCAN;  Surgeon: Jerrell Belfast, MD;  Location: [facility];  Service: ENT;  Laterality: Bilateral;  ? ?Family History  ?Problem Relation Age of Onset  ? Hypertension Other   ?     M, B  ? Hypertension Mother   ? ?Outpatient Medications Prior to Visit  ?Medication Sig Dispense Refill  ? aspirin EC 81 MG EC tablet Take 1 tablet (81 mg total) by mouth daily. Swallow whole. 30 tablet 11  ? atorvastatin (LIPITOR) 80 MG tablet Take 1 tablet (80 mg total) by mouth daily. 90 tablet 3  ? lisinopril-hydrochlorothiazide (ZESTORETIC) 20-12.5 MG tablet Take 2 tablets by mouth daily. (Patient not taking: Reported on 07/22/2021) 180 tablet 3  ? ?No facility-administered medications prior to visit.  ? ?No Known Allergies ?   ?Objective:  ? ?Today's Vitals  ? 07/22/21 1550  ?BP: 140/90  ?Pulse: 80  ?Temp: 97.9 ?F (36.6 ?C)  ?TempSrc: Temporal  ?SpO2: 97%  ?Weight: 183 lb 6.4 oz (83.2 kg)  ?Height: 5\' 7"  (1.702 m)  ? ?Body mass index is 28.72 kg/m?.  ? ?General: Well developed, well nourished. No acute distress. ?Psych: Alert and oriented. Normal mood and affect. ? ?Repeat BP was 150/88. ? ?Health Maintenance Due  ?Topic Date Due  ? Hepatitis C Screening  Never done  ? COLONOSCOPY (Pts 45-28yrs Insurance coverage will need to be confirmed)  Never done  ? Zoster  Vaccines- Shingrix (1 of 2) Never done  ? ?Lab Results (06/04/2021) ? ?Lipid Panel ? Total Cholesterol: 147 mg/dL ? Triglycerides:  49.2 mg/dL ? LDL Cholesterol: 93.0 mg/dl ? ?TSH: 2.40 mU/L ? ?PSA: 0.52 ng/mL ? ?Comprehensive Metabolic Panel ? Sodium: 141.7 mEq/L ? Potassium: 4.01 mEq/L ?  ?Complete Blood Count: ? WBC:  5.5 K/uL ? RBC:  5.33 M/mm3 ? Hemoglobin: 14.7 gm/dL ? Hematocrit: 47.5 % ? MCV:  89.1 fL ? MCH:  27.6 pg ? MCHC:  31.0 g/dL ? Platelets: 213.0 K/cumm ? ?   ?Assessment &  Plan:  ? ?1. Essential hypertension ?We do not have the same combination of amlodipine-irbesartan available in the [place]. I will switch in to amlodipine-valsartan instead and see how this does for maintaining his blood pressure control. I will have him back in 6 weeks to reassess. ? ?- amLODipine-valsartan (EXFORGE) 10-320 MG tablet; Take 1 tablet by mouth daily.  Dispense: 90 tablet; Refill: 3 ? ?Return in about 6 weeks (around 09/02/2021) for Reassessment, review home BP log.  ? ?Haydee Salter, MD ?

## 2021-08-17 ENCOUNTER — Ambulatory Visit: Payer: 59 | Admitting: Family Medicine

## 2021-08-24 ENCOUNTER — Ambulatory Visit: Payer: 59 | Admitting: Family Medicine

## 2021-08-27 ENCOUNTER — Other Ambulatory Visit: Payer: Self-pay | Admitting: Family Medicine

## 2021-08-27 DIAGNOSIS — E785 Hyperlipidemia, unspecified: Secondary | ICD-10-CM

## 2021-09-23 ENCOUNTER — Ambulatory Visit (INDEPENDENT_AMBULATORY_CARE_PROVIDER_SITE_OTHER): Payer: 59 | Admitting: Family Medicine

## 2021-09-23 ENCOUNTER — Encounter: Payer: Self-pay | Admitting: Family Medicine

## 2021-09-23 VITALS — BP 142/76 | HR 72 | Temp 97.4°F | Ht 67.0 in | Wt 184.8 lb

## 2021-09-23 DIAGNOSIS — I10 Essential (primary) hypertension: Secondary | ICD-10-CM | POA: Diagnosis not present

## 2021-09-23 MED ORDER — HYDROCHLOROTHIAZIDE 25 MG PO TABS: 25.0000 mg | ORAL_TABLET | Freq: Every day | ORAL | 3 refills | Status: AC

## 2021-09-23 NOTE — Progress Notes (Signed)
[facility] [address] [place] [address] Dept: [phone] Dept Fax: [phone]  Chronic Care Office Visit  Subjective:    Patient ID: Manuel Peters, male    DOB: 06/09/1960, 61 y.o..   MRN: [id number]  Chief Complaint  Patient presents with   Follow-up    6 week f/u. At home BP ranging 132/84 -135/82.   No concerns.       History of Present Illness:  Mr. Abbs has a history of hypertension and a previous stroke. He had been being managed on lisinopril-HCTZ. During a visit earlier this year to the [place] he saw a cardiologist who switched his blood pressure medicine to a combination product of irbesartan 300 mg-amlodpine 10 mg.  As we did not have the same combination pill available in the [place], I switched him to amlodipine-valsartan (Exforge) 10-320 mg a this last visit. He feels like he is taking the new medicine without problems. He has been checking his blood pressures at home and notes an average of 133/85.  Past Medical History: Patient Active Problem List   Diagnosis Date Noted   History of arterial ischemic stroke 10/08/2020   Dyslipidemia (high LDL; low HDL) 09/23/2020   Acute ischemic stroke ([place]) 05/29/2020   Sleep apnea    Congenital absence of one kidney 07/26/2019   Uses Spanish as primary spoken language 07/26/2019   Vitiligo 07/26/2019   Deviated septum 07/11/2014   Multiple nasal polyps 07/11/2014   Essential hypertension 06/20/2014   Allergic rhinitis 06/20/2014   Back pain 07/31/2010   Past Surgical History:  Procedure Laterality Date   COLONOSCOPY  2018   Reportedly normal. Repeat recommended in 10 years.   HERNIA REPAIR  1980s   inguinal    NASAL SEPTOPLASTY W/ TURBINOPLASTY Bilateral 07/11/2014   Procedure: NASAL SEPTOPLASTY WITH BILATERAL INFERIOR TURBINATE REDUCTION;  Surgeon: Jerrell Belfast, MD;  Location: [facility];  Service: ENT;  Laterality: Bilateral;    SINUS ENDO W/FUSION Bilateral 07/11/2014   Procedure: BILATERAL ENDOSCOPIC SINUS SURGERY/RESECTION OF INTRA NASAL POLYPS WITH FUSION SCAN;  Surgeon: Jerrell Belfast, MD;  Location: [facility];  Service: ENT;  Laterality: Bilateral;   Family History  Problem Relation Age of Onset   Hypertension Other        M, B   Hypertension Mother    Outpatient Medications Prior to Visit  Medication Sig Dispense Refill   amLODipine-valsartan (EXFORGE) 10-320 MG tablet Take 1 tablet by mouth daily. 90 tablet 3   aspirin EC 81 MG EC tablet Take 1 tablet (81 mg total) by mouth daily. Swallow whole. 30 tablet 11   atorvastatin (LIPITOR) 80 MG tablet TAKE 1 TABLET (80 MG TOTAL) BY MOUTH DAILY. 90 tablet 1   No facility-administered medications prior to visit.   No Known Allergies    Objective:   Today's Vitals   09/23/21 1459  BP: (!) 142/76  Pulse: 72  Temp: (!) 97.4 F (36.3 C)  TempSrc: Temporal  SpO2: 97%  Weight: 184 lb 12.8 oz (83.8 kg)  Height: 5\' 7"  (1.702 m)   Body mass index is 28.94 kg/m.   General: Well developed, well nourished. No acute distress. Psych: Alert and oriented. Normal mood and affect.  Health Maintenance Due  Topic Date Due   COLONOSCOPY (Pts 45-30yrs Insurance coverage will need to be confirmed)  Never done   Zoster Vaccines- Shingrix (1 of 2) Never done     Assessment & Plan:  1. Essential hypertension Mr. Tachibana blood pressure remains mildly above goal. I will add HCTZ to his regimen. We will reassess in 6 weeks.  - hydrochlorothiazide (HYDRODIURIL) 25 MG tablet; Take 1 tablet (25 mg total) by mouth daily.  Dispense: 90 tablet; Refill: 3   Return in about 6 weeks (around 11/04/2021) for Reassessment.   Haydee Salter, MD

## 2021-11-04 ENCOUNTER — Ambulatory Visit (INDEPENDENT_AMBULATORY_CARE_PROVIDER_SITE_OTHER): Payer: 59 | Admitting: Family Medicine

## 2021-11-04 ENCOUNTER — Encounter: Payer: Self-pay | Admitting: Family Medicine

## 2021-11-04 VITALS — BP 120/72 | HR 81 | Temp 97.6°F | Ht 67.0 in | Wt 186.8 lb

## 2021-11-04 DIAGNOSIS — I10 Essential (primary) hypertension: Secondary | ICD-10-CM

## 2021-11-04 NOTE — Progress Notes (Signed)
[facility] [address] [place] [address] Dept: [phone] Dept Fax: [phone]  Chronic Care Office Visit  Subjective:    Patient ID: Manuel Peters, male    DOB: 03-23-1961, 61 y.o..   MRN: [id number]  Chief Complaint  Patient presents with   Follow-up    6 week f/u HTN. No questions or concerns.    History of Present Illness:  Patient is in today for reassessment of chronic medical issues.  Manuel Peters has a history of hypertension and a previous stroke. He had been being managed on lisinopril-HCTZ. During a visit earlier this year to the [place], he saw a cardiologist who switched his blood pressure medicine to a combination product of irbesartan 300 mg-amlodpine 10 mg.  As we did not have the same combination pill available in the [place], I switched him to amlodipine-valsartan (Exforge) 10-320 mg At his last visit, his home BP average was 133/85. I added HCTZ 25 mg daily to his regimen. He notes he is taking this well without problems.  Past Medical History: Patient Active Problem List   Diagnosis Date Noted   History of arterial ischemic stroke 10/08/2020   Dyslipidemia (high LDL; low HDL) 09/23/2020   Acute ischemic stroke ([facility]) 05/29/2020   Sleep apnea    Congenital absence of one kidney 07/26/2019   Uses Spanish as primary spoken language 07/26/2019   Vitiligo 07/26/2019   Deviated septum 07/11/2014   Multiple nasal polyps 07/11/2014   Essential hypertension 06/20/2014   Allergic rhinitis 06/20/2014   Back pain 07/31/2010   Past Surgical History:  Procedure Laterality Date   COLONOSCOPY  2018   Reportedly normal. Repeat recommended in 10 years.   HERNIA REPAIR  1980s   inguinal    NASAL SEPTOPLASTY W/ TURBINOPLASTY Bilateral 07/11/2014   Procedure: NASAL SEPTOPLASTY WITH BILATERAL INFERIOR TURBINATE REDUCTION;  Surgeon: Osborn Coho, MD;  Location: [facility];  Service: ENT;   Laterality: Bilateral;   SINUS ENDO W/FUSION Bilateral 07/11/2014   Procedure: BILATERAL ENDOSCOPIC SINUS SURGERY/RESECTION OF INTRA NASAL POLYPS WITH FUSION SCAN;  Surgeon: Osborn Coho, MD;  Location: [facility];  Service: ENT;  Laterality: Bilateral;   Family History  Problem Relation Age of Onset   Hypertension Other        M, B   Hypertension Mother    Outpatient Medications Prior to Visit  Medication Sig Dispense Refill   amLODipine-valsartan (EXFORGE) 10-320 MG tablet Take 1 tablet by mouth daily. 90 tablet 3   aspirin EC 81 MG EC tablet Take 1 tablet (81 mg total) by mouth daily. Swallow whole. 30 tablet 11   atorvastatin (LIPITOR) 80 MG tablet TAKE 1 TABLET (80 MG TOTAL) BY MOUTH DAILY. 90 tablet 1   hydrochlorothiazide (HYDRODIURIL) 25 MG tablet Take 1 tablet (25 mg total) by mouth daily. 90 tablet 3   No facility-administered medications prior to visit.   No Known Allergies    Objective:   Today's Vitals   11/04/21 1353  BP: 120/72  Pulse: 81  Temp: 97.6 F (36.4 C)  TempSrc: Temporal  SpO2: 98%  Weight: 186 lb 12.8 oz (84.7 kg)  Height: 5\' 7"  (1.702 m)   Body mass index is 29.26 kg/m.   General: Well developed, well nourished. No acute distress. Psych: Alert and oriented. Normal mood and affect.  Health Maintenance Due  Topic Date Due   COLONOSCOPY (Pts 45-80yrs Insurance coverage will need to be confirmed)  Never  done   Zoster Vaccines- Shingrix (1 of 2) Never done     Assessment & Plan:   1. Essential hypertension Blood pressure at goal on amlodipine-valsartan (Exforge) 10-320 mg and HCTZ 25 mg daily. We will cotninue this. I will reassess him in 3 months.   Return in about 3 months (around 02/04/2022) for Reassessment.   Loyola Mast, MD

## 2021-11-16 ENCOUNTER — Telehealth: Payer: Self-pay | Admitting: Adult Health

## 2021-11-16 NOTE — Progress Notes (Unsigned)
[facility] [address] [place]. [place] [address] [phone]       STROKE FOLLOW UP NOTE  [name] Date of Birth:  [birth year] Medical Record Number:  [id number]   Reason for Referral: stroke follow up    SUBJECTIVE:   CHIEF COMPLAINT:  No chief complaint on file.    HPI:   Update 11/16/2021 JM: Patient returns for 45-month follow-up with history of stroke and CPAP accompanied by *** and [place] interpreter.  Stroke:  Prior concerns of worsening balance therefore completed MR brain which did not show any acute or new findings compared to prior imaging.  Balance has been stable since prior visit ***.    Denies new stroke/TIA symptoms.  Compliant on aspirin atorvastatin, denies side effects.  Blood pressure today ***.  Routinely followed by PCP.    OSA on CPAP: Denies any difficulty tolerating or any CPAP related concerns today.       History provided for reference purposes only  Update 05/19/2021 JM: Returns for stroke follow-up after prior visit 7 months ago accompanied by his wife and [facility] interpreter. Per wife, he has had imbalance since stroke but worsening over the past 5 months. At prior visits, he denies any residual stroke deficits. She reports at times he will lean towards the side while ambulating but unable to say if one specific side.  Pt denies having any issues with imbalance or ambulation.  Denies any falls.  Denies any other associated stroke/TIA symptoms.  Continues to work doing Producer, television/film/video and denies any difficulties with this.  Denies any new medications around the time of worsening.  Has been trying to eat healthy.  Drinks 30-40 oz of water per day. Also reports 2-3 months ago, episode of dizziness eval by PCP who felt consistent with BPPV and no recurrence since that time (although per epic review, saw PCP on 12/23 reporting episode occurred on 12/18).  Compliant on aspirin and atorvastatin without side  effects.  Previously on Zetia but self discontinued as he did not feel this was needed.  Recent lipid panel showed LDL 41 down from 118.  Blood pressure today 154/76.    Reports compliance on CPAP.  Previously followed by [facility] neurology due to insurance reasons, he requested transfer to our office for ongoing monitoring and management. He brough CPAP today with chip for download.  CPAP compliance report from 04/19/2021 -05/18/2021 shows 29 out of 30 usage days for 97% compliance and 83% compliance for greater than 4 hours usage.  Average usage 5 hours and 39 minutes.  Residual AHI 0.7 with mean pressure 8 and max pressure 13 with EPR level 2.  Pressure in the 95th percentile 11.7.  Leaks in the 95th percentile 24.1. No CPAP related concerns -tolerating well.  No further concerns at this time   Update 10/27/2020 JM: [name] returns for 25-month stroke follow-up accompanied by [facility] interpreter.  He has been doing well since prior visit reporting complete recovery of prior deficits and denies new stroke/TIA symptoms.  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  Compliant on aspirin and atorvastatin 80 mg daily without associated side effects.  Recent lipid panel by PCP showed continued elevated LDL 118 therefore added Zetia 10 mg daily.  Blood pressure today 146/88 on lisinopril-hydrochlorothiazide per PCP. Monitors at home and typically stable - does report occasional elevated levels at home but typically checks BP in the AM prior to taking his  BP medication -usually stable during. Use of CPAP nightly.  No further concerns at this time.   Initial visit 06/26/2020 JM: [name] is being seen for hospital follow-up accompanied by his daughter who assists with interpretation.  Residual imbalance more so towards the left side (or leaning towards left) but overall improvement since discharge He will occasionally have vertigo when laying in bed but  this has greatly improvement.  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 male with medical history significant for hypertension and OSA on CPAP, and recently seen by rheumatology for positive ANA.  He presented to [facility] ED on 05/28/2020 for acute onset of dizziness associated with nausea and vomiting and found to have significant nystagmus and truncal lean upon exam as well as Covid positive.  Personally reviewed hospitalization pertinent progress notes, lab work and imaging with summary provided.  Evaluated by Dr. 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  Recommended DAPT for 3 weeks and aspirin alone.  LDL 136 and initiate atorvastatin 80 mg daily.  Other stroke risk factors include HTN, asymptomatic COVID infection, significant family history of stroke (mother and sibling died from stroke and remaining brother significant paralysis s/p stroke) and OSA on CPAP.  No personal prior history of stroke.  Evaluated by therapies and recommended OP PT/OT discharged home in stable condition.    Stroke - Midbrain stroke likely related to SVD Code Stroke CT ASPECTS 10.  CTA head & neck: Normal head CT.  No acute intracranial abnormality identified.  MRI: Subtle 8 mm ill-defined diffusion  signal abnormality involving the central aspect of the midbrain, suspicious for possible acute small vessel ischemia given the patient's history. No associated hemorrhage or mass effect. Carotid Doppler unremarkable 2D Echo EF 60-65% LDL  136 HgbA1c 5.6 Covid testing positive UDS negative VTE prophylaxis is recommended.  Passed bedside swallow eval. On heart healthy diet.  Recommend aspirin and Plavix DAPT for 3 weeks and then aspirin alone. Therapy recommendations:  CIR --> OP PT/OT Disposition:  home    ROS:   14 system review of systems performed and negative with exception of those listed in HPI  PMH:  Past Medical History:  Diagnosis Date   Elevated BP    Hypertension    Sinus congestion    Sleep apnea    does not use a cpap   Stroke (HCC)     PSH:  Past Surgical History:  Procedure Laterality Date   COLONOSCOPY  2018   Reportedly normal. Repeat recommended in 10 years.   HERNIA REPAIR  1980s   inguinal    NASAL SEPTOPLASTY W/ TURBINOPLASTY Bilateral 07/11/2014   Procedure: NASAL SEPTOPLASTY WITH BILATERAL INFERIOR TURBINATE REDUCTION;  Surgeon: Osborn Coho, MD;  Location: [facility];  Service: ENT;  Laterality: Bilateral;  SINUS ENDO W/FUSION Bilateral 07/11/2014   Procedure: BILATERAL ENDOSCOPIC SINUS SURGERY/RESECTION OF INTRA NASAL POLYPS WITH FUSION SCAN;  Surgeon: Osborn Coho, MD;  Location: [facility];  Service: ENT;  Laterality: Bilateral;    Social History:  Social History   Socioeconomic History   Marital status: Married    Spouse name: [name]   Number of children: Not on file   Years of education: Not on file   Highest education level: Not on file  Occupational History   Not on file  Tobacco Use   Smoking status: Never   Smokeless tobacco: Never  Vaping Use   Vaping Use: Never used  Substance and Sexual Activity   Alcohol use: Yes    Comment: rarely   Drug use: No   Sexual activity: Not on file  Other  Topics Concern   Not on file  Social History Narrative   Original from [place]         Social Determinants of Health   Financial Resource Strain: Not on file  Food Insecurity: Not on file  Transportation Needs: Not on file  Physical Activity: Not on file  Stress: Not on file  Social Connections: Not on file  Intimate Partner Violence: Not on file    Family History:  Family History  Problem Relation Age of Onset   Hypertension Other        M, B   Hypertension Mother     Medications:   Current Outpatient Medications on File Prior to Visit  Medication Sig Dispense Refill   amLODipine-valsartan (EXFORGE) 10-320 MG tablet Take 1 tablet by mouth daily. 90 tablet 3   aspirin EC 81 MG EC tablet Take 1 tablet (81 mg total) by mouth daily. Swallow whole. 30 tablet 11   atorvastatin (LIPITOR) 80 MG tablet TAKE 1 TABLET (80 MG TOTAL) BY MOUTH DAILY. 90 tablet 1   hydrochlorothiazide (HYDRODIURIL) 25 MG tablet Take 1 tablet (25 mg total) by mouth daily. 90 tablet 3   No current facility-administered medications on file prior to visit.    Allergies:  No Known Allergies    OBJECTIVE:  Physical Exam  There were no vitals filed for this visit.  There is no height or weight on file to calculate BMI. No results found.  General: well developed, well nourished, pleasant elderly male, seated, in no evident distress Head: head normocephalic and atraumatic.   Neck: supple with no carotid or supraclavicular bruits Cardiovascular: regular rate and rhythm, no murmurs Musculoskeletal: no deformity Skin:  no rash/petichiae Vascular:  Normal pulses all extremities   Neurologic Exam Mental Status: Awake and fully alert. Denies dysarthria or aphasia. Primary language of Spanish with some broken [place]. Oriented to place and time. Recent and remote memory intact. Attention span, concentration and fund of knowledge appropriate. Mood and affect appropriate.  Cranial Nerves: Pupils  equal, briskly reactive to light. Extraocular movements full without nystagmus. Visual fields full to confrontation. Hearing intact. Facial sensation intact. Face, tongue, palate moves normally and symmetrically.  Motor: Normal bulk and tone. Normal strength in all tested extremity muscles Sensory.: intact to touch , pinprick , position and vibratory sensation.  Coordination: Rapid alternating movements normal in all extremities. Finger-to-nose and heel-to-shin performed accurately bilaterally. Gait and Station: Arises from chair without difficulty. Stance is normal. Gait demonstrates normal stride length and balance without use of assistive device.  Difficulty performing tandem walk and mild difficulty performing heel toe.  Romberg slightly positive.  Reflexes: 1+ and symmetric.  Toes downgoing.         ASSESSMENT: Manuel Peters is a 61 y.o. 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 Vascular risk factors include HTN, HLD, OSA on CPAP and significant family history of stroke.      PLAN:  Midbrain stroke:  Per wife, persistent imbalance since stroke although patient previously denied residual deficits and no imbalance noted on prior exam. Wife reports worsening imbalance over the past 5 months. Evidence of imbalance noted on exam. No other new neurological symptoms or concerning findings on exam.  Declined PT. Discussed fall prevention MR brain 05/2021 (for worsening imbalance) no acute findings or new concerns since prior imaging Continue aspirin 81 mg daily  and atorvastatin 80 mg daily for secondary stroke prevention.   Discussed secondary stroke prevention measures and importance of close PCP follow up for aggressive stroke risk factor management including BP goal<130/90, and HLD with LDL goal<70  OSA  on CPAP: previously followed by [facility] neurology with HST 10/2019 - per insurance, he needs to establish care with [facility] health and requests transfer of care to our office. Review of CPAP download shows satisfactory compliance with optimal residual AHI.  Continue current settings. Wife will call with information regarding DME company to obtain ongoing supplies. Order will be placed at that time.     Follow up in 6 months or call earlier if needed   CC:  Rudd, Bertram Millard, MD    I spent 43 minutes of face-to-face and non-face-to-face time with patient and wife.  This included previsit chart review, lab review, study review, order entry, electronic health record documentation, patient and wife education and discussion regarding history of prior stroke and secondary stroke prevention and aggressive stroke risk factor management, worsening imbalance concerns, OSA on CPAP with review and discussion of download report and answered all other questions to patient satisfaction   Ihor Austin, [facility]  [facility] [address] [place], [place] [address]  Phone [phone] Fax [phone] Note: This document was prepared with digital dictation and possible smart phrase technology. Any transcriptional errors that result from this process are unintentional.

## 2021-11-16 NOTE — Telephone Encounter (Signed)
Noted. Will discuss at visit with patient tomorrow. Thank you.

## 2021-11-16 NOTE — Telephone Encounter (Signed)
Pt daughter is calling and would like to give the nurse some information before Pt appointment. Pt daughter is requesting a return call

## 2021-11-16 NOTE — Telephone Encounter (Signed)
FYI for [employer] pt daughter, he stated he has been drinking more lately to the point of falling and hurting himself and His balance is off. Pt wants provider to be aware and if possible to advise pt of risk he is taking drinking a lot in the last 2-3 months and mixing medications.

## 2021-11-16 NOTE — Telephone Encounter (Signed)
Contacted pt, reminded him to bring cpap into appt tomorrow. Pt verbally understood

## 2021-11-17 ENCOUNTER — Ambulatory Visit: Payer: 59 | Admitting: Adult Health

## 2021-11-17 ENCOUNTER — Encounter: Payer: Self-pay | Admitting: Adult Health

## 2021-11-17 VITALS — BP 159/93 | HR 68 | Ht 66.0 in | Wt 184.0 lb

## 2021-11-17 DIAGNOSIS — I639 Cerebral infarction, unspecified: Secondary | ICD-10-CM | POA: Diagnosis not present

## 2021-11-17 DIAGNOSIS — G4733 Obstructive sleep apnea (adult) (pediatric): Secondary | ICD-10-CM | POA: Diagnosis not present

## 2021-11-17 DIAGNOSIS — I69398 Other sequelae of cerebral infarction: Secondary | ICD-10-CM | POA: Diagnosis not present

## 2021-11-17 DIAGNOSIS — R2689 Other abnormalities of gait and mobility: Secondary | ICD-10-CM | POA: Diagnosis not present

## 2021-11-17 DIAGNOSIS — Z9989 Dependence on other enabling machines and devices: Secondary | ICD-10-CM

## 2021-11-17 NOTE — Patient Instructions (Addendum)
Please continue nightly use of CPAP for sleep apnea management, follow up with your DME company [employer] for any needed supplies or CPAP related concerns   Please be mindful of certain activities that can increase your risk of falling such as playing certain sports such as basketball. Also limit alcohol intake to no more than 1-2 drinks per day as this can also effect your balance, increase risk of strokes and other health related concerns  Continue aspirin 81 mg daily  and atorvastatin  for secondary stroke prevention  Continue to follow up with PCP regarding cholesterol and blood pressure management  Maintain strict control of hypertension with blood pressure goal below 130/90 and cholesterol with LDL cholesterol (bad cholesterol) goal below 70 mg/dL.   Signs of a Stroke? Follow the BEFAST method:  Balance Watch for a sudden loss of balance, trouble with coordination or vertigo Eyes Is there a sudden loss of vision in one or both eyes? Or double vision?  Face: Ask the person to smile. Does one side of the face droop or is it numb?  Arms: Ask the person to raise both arms. Does one arm drift downward? Is there weakness or numbness of a leg? Speech: Ask the person to repeat a simple phrase. Does the speech sound slurred/strange? Is the person confused ? Time: If you observe any of these signs, call 911.    We can follow up in 1 year for CPAP follow up In regards to stroke, as you have been stable, you can follow up on an as needed basis, please ensure you continue to follow closely with your PCP Dr. Veto Kemps for stroke risk factor management       Thank you for coming to see [place] at [facility]. I hope we have been able to provide you high quality care today.  You may receive a patient satisfaction survey over the next few weeks. We would appreciate your feedback and comments so that we may continue to improve ourselves and the health of our patients.

## 2022-01-12 ENCOUNTER — Other Ambulatory Visit: Payer: Self-pay | Admitting: Family Medicine

## 2022-01-12 DIAGNOSIS — E785 Hyperlipidemia, unspecified: Secondary | ICD-10-CM

## 2022-02-04 ENCOUNTER — Ambulatory Visit: Payer: Self-pay | Admitting: Family Medicine

## 2022-02-04 ENCOUNTER — Telehealth: Payer: Self-pay | Admitting: Family Medicine

## 2022-02-04 NOTE — Telephone Encounter (Signed)
Pt was a no show for an OV with Dr. Gena Fray on 02/04/22, I sent a letter.

## 2022-02-09 NOTE — Telephone Encounter (Signed)
1st no show, fee waived, letter sent 

## 2022-04-30 ENCOUNTER — Other Ambulatory Visit: Payer: Self-pay | Admitting: Family Medicine

## 2022-04-30 DIAGNOSIS — I10 Essential (primary) hypertension: Secondary | ICD-10-CM

## 2022-05-10 ENCOUNTER — Encounter: Payer: Self-pay | Admitting: Adult Health

## 2022-05-10 ENCOUNTER — Telehealth: Payer: Self-pay

## 2022-05-10 ENCOUNTER — Ambulatory Visit: Payer: BLUE CROSS/BLUE SHIELD | Admitting: Adult Health

## 2022-05-10 VITALS — BP 127/76 | HR 73 | Ht 66.0 in | Wt 188.0 lb

## 2022-05-10 DIAGNOSIS — R200 Anesthesia of skin: Secondary | ICD-10-CM | POA: Diagnosis not present

## 2022-05-10 DIAGNOSIS — E785 Hyperlipidemia, unspecified: Secondary | ICD-10-CM

## 2022-05-10 DIAGNOSIS — I639 Cerebral infarction, unspecified: Secondary | ICD-10-CM | POA: Diagnosis not present

## 2022-05-10 DIAGNOSIS — R202 Paresthesia of skin: Secondary | ICD-10-CM

## 2022-05-10 DIAGNOSIS — I6523 Occlusion and stenosis of bilateral carotid arteries: Secondary | ICD-10-CM

## 2022-05-10 DIAGNOSIS — G4733 Obstructive sleep apnea (adult) (pediatric): Secondary | ICD-10-CM

## 2022-05-10 DIAGNOSIS — I669 Occlusion and stenosis of unspecified cerebral artery: Secondary | ICD-10-CM

## 2022-05-10 NOTE — Patient Instructions (Addendum)
Your Plan:  You will be called by your DME company to get a mask refitting, very important to use CPAP nightly for secondary stroke prevention measures   Would recommend trying to use a carpal tunnel brace at night or with any activity that can cause symptoms. If symptoms persist or worsen, we can consider further evaluation with EMG/NCV to look for carpal tunnel syndrome severity   Continue aspirin and atorvastatin for secondary stroke prevention measures   Repeat lipid panel today to ensure accuracy of recent level   You will be called to schedule repeat CTA head/neck     Follow up in 6 months or call earlier if needed      Thank you for coming to see [place] at [facility]. I hope we have been able to provide you high quality care today.  You may receive a patient satisfaction survey over the next few weeks. We would appreciate your feedback and comments so that we may continue to improve ourselves and the health of our patients.     Sndrome del tnel carpiano Carpal Tunnel Syndrome  El sndrome del tnel carpiano es una afeccin que causa dolor, debilidad y adormecimiento en la mano y los dedos. El adormecimiento es cuando no siente una zona del cuerpo. El tnel carpiano es un rea estrecha que se encuentra en el lado palmar de la [place]. Los movimientos repetidos de la mueca o determinadas enfermedades pueden causar hinchazn en el tnel. Esta hinchazn puede comprimir el nervio principal de la [place]. Este nervio se llama "nervio mediano". Cules son las causas? Esta afeccin puede ser causada por lo siguiente: Mover la mano y la [place] y otra vez mientras realiza una tarea. Lesin en la [place]. Artritis. Un saco lleno de lquido (quiste) o un crecimiento anormal (tumor) en el tnel carpiano. Acumulacin de lquido Solicitor. Uso de herramientas que vibran. Algunas veces, la causa no se conoce. Qu incrementa el riesgo? Los siguientes  factores pueden hacer que sea ms propenso a Best boy esta afeccin: Tener un trabajo en el que deba hacer estas cosas: Mover la mano una y [place]. Trabajar con herramientas que vibran, como taladros o lijadoras. Ser mujer. Tener diabetes, obesidad, problemas de tiroides o insuficiencia renal. Cules son los signos o sntomas? Los sntomas de esta afeccin incluyen: Sensacin de hormigueo en los dedos. Hormigueo o prdida de la sensibilidad de Insurance risk surveyor. Dolor en todo el brazo. Este dolor puede empeorar al flexionar la [place] y el codo durante [place]. Dolor en la mueca que sube por el brazo hasta el hombro. Dolor que baja hasta la palma de la mano o los dedos. Debilidad en las manos. Puede resultarle difcil tomar y Licensed conveyancer. Es posible que se sienta peor por la noche. Cmo se trata? El tratamiento de esta afeccin puede incluir: Cambios en el estilo de vida. Se le pedir que deje o cambie la actividad que caus el problema. Hacer ejercicios y actividades para que los [place], los msculos y los tendones se vuelvan ms fuertes (fisioterapia). Aprender a Control and instrumentation engineer nuevamente (terapia ocupacional). Medicamentos para Conservation officer, historic buildings y la hinchazn. Es posible que le apliquen inyecciones en la [place]. Una frula o un dispositivo ortopdico para la [place]. Ciruga. Siga estas instrucciones en su casa: Si tiene una frula o un dispositivo ortopdico: Use la frula o el dispositivo ortopdico como se lo haya indicado el mdico. Quteselos solamente como se lo haya indicado el mdico. Afloje la frula si los  dedos: Hormiguean. Se adormecen. Se tornan fros y de Edison International. Mantenga la frula o el dispositivo ortopdico limpios. Si la frula o el dispositivo ortopdico no son impermeables: No deje que se mojen. Cbralos con un envoltorio hermtico cuando tome un bao de inmersin o una ducha. Control del dolor, la rigidez y la hinchazn Si se lo indican, aplique hielo sobre la zona  dolorida: Si tiene un dispositivo ortopdico o una frula desmontable, quteselos como se lo haya indicado el mdico. Ponga el hielo en una bolsa plstica. Coloque una toalla entre la piel y Copy. Coloque el hielo durante 20 minutos, 2 a 3 veces al da. No se quede dormido con la bolsa de hielo International [employer]. Retire el hielo si la piel se le pone de color rojo brillante. Esto es Intel. Si no puede sentir dolor, calor o fro, tiene un mayor riesgo de que se dae la zona. Mueva los dedos con frecuencia para reducir la rigidez y la hinchazn. Instrucciones generales Baxter International de venta libre y los recetados solamente como se lo haya indicado el mdico. Descanse la [place] de cualquier actividad que le cause dolor. Si es necesario, hable con su jefe en el Standard Pacific cambios que pueden ayudar a la curacin de la [place]. Haga los ejercicios que le hayan indicado el mdico, el fisioterapeuta o el terapeuta ocupacional. Cumpla con todas las visitas de seguimiento. Comunquese con un mdico si: Aparecen nuevos sntomas. Los medicamentos no Tourist information centre manager. Sus sntomas empeoran. Solicite ayuda de inmediato si: Tiene adormecimiento u hormigueo muy intensos en la mueca o la [place]. Resumen El sndrome del tnel carpiano es una afeccin que causa dolor en la mano y en el brazo. Suele deberse a movimientos repetidos de Warden/ranger. Este problema se trata mediante cambios en el estilo de vida y medicamentos. La ciruga puede ser necesaria en casos muy graves. Siga las instrucciones del mdico sobre el uso de una frula, el reposo de la [place], la asistencia a las consultas de seguimiento y Freight forwarder para pedir ayuda. Esta informacin no tiene Theme park manager el consejo del mdico. Asegrese de hacerle al mdico cualquier pregunta que tenga. Document Revised: 10/05/2019 Document Reviewed: 10/05/2019 Elsevier Patient Education  2023 [employer].

## 2022-05-10 NOTE — Progress Notes (Signed)
[facility] [address] [place]. [place] [address] [phone]       STROKE FOLLOW UP NOTE  [name] Date of Birth:  December 24, 1960 Medical Record Number:  [id number]    Reason for visit: Recent imaging follow-up    SUBJECTIVE:   CHIEF COMPLAINT:  Chief Complaint  Patient presents with   Follow-up    RM 2 with spouse gloria & [facility] interpreter Pt is well, here to FU on clogged arteries that were seen  on a CT done in home country.      HPI:   Update 05/10/2022 JM: Patient returns per request to discuss imaging that was completed in [place]. Reports being seen by a neurologist in the [place] basically to establish care while he was visiting as he was staying there for 2-3 months, denies being seen for any new or concerning neurological symptoms at that time, they completed imaging, wife believes it was some type of CT scan which showed "clogged arteries". Unable to view via epic.  Prior CTA head 05/2020 did not show any concerning findings.  Carotid Doppler 05/2020 near normal bilateral carotid arteries. They are concerned regarding this new finding.  He does report compliance on aspirin and atorvastatin.  Cholesterol levels in [place] showed LDL 152 (prior 41, 05/2021).  Blood pressure has been well-controlled.  He also reports over the past month, he has been experiencing occasional left whole hand numbness usually upon awakening or if leaning on elbows. Resolves quickly after position change or movement.  Denies any neck pain.   He has had difficulty tolerating CPAP mask over the past several months. He has difficulty tolerating for prolonged period of time. He questions being a candidate for inspire device.      History provided for reference purposes only Update 11/16/2021 JM: Patient returns for 22-month follow-up with history of stroke and CPAP accompanied by [facility] interpreter.  Stroke:  Prior concerns of  worsening balance,  completed repeat MR brain which did not show any acute or new findings compared to prior imaging. He declines any issues with his balance currently and denies any new stroke/TIA symptoms.  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  Daughter contacted office yesterday with concerns of increased EtOH use over the past 2 to 3 months and "mixing medications" which she believes has been contributing to frequent falls and worsening balance.  Per patient, reports drinking mixed drinks with whiskey usually about 2 drinks per week.  Denies any excessive alcohol intake or daily alcohol intake.he did have recent fall while playing basketball, thankfully without any significant injury. Denies having issues with frequent falls or any other falls since that time.   Reports compliance on aspirin and atorvastatin, denies side effects Blood pressure today 159/93, monitors at home typically 120-130s/70-80s.  Closely follows with PCP   OSA on CPAP:  reports tolerating CPAP well. Did have some issues last night with mask being too loose but this is not a common concern. He did receive new supplies from [facility] recently.  Epworth Sleepiness Scale 9/24.  Fatigue severity scale 24/63.  No other CPAP related concerns today.        Update 05/19/2021 JM: Returns for stroke follow-up after prior visit 7 months ago accompanied by his wife and [facility] interpreter. Per wife, he has had imbalance since stroke but worsening over the past 5 months. At prior visits, he denies any residual stroke deficits. She reports at times he  will lean towards the side while ambulating but unable to say if one specific side.  Pt denies having any issues with imbalance or ambulation.  Denies any falls.  Denies any other associated stroke/TIA symptoms.  Continues to work doing Producer, television/film/video and denies any difficulties with this.  Denies any new medications around the time of worsening.  Has been  trying to eat healthy.  Drinks 30-40 oz of water per day. Also reports 2-3 months ago, episode of dizziness eval by PCP who felt consistent with BPPV and no recurrence since that time (although per epic review, saw PCP on 12/23 reporting episode occurred on 12/18).  Compliant on aspirin and atorvastatin without side effects.  Previously on Zetia but self discontinued as he did not feel this was needed.  Recent lipid panel showed LDL 41 down from 118.  Blood pressure today 154/76.    Reports compliance on CPAP.  Previously followed by [facility] neurology due to insurance reasons, he requested transfer to our office for ongoing monitoring and management. He brough CPAP today with chip for download.  CPAP compliance report from 04/19/2021 -05/18/2021 shows 29 out of 30 usage days for 97% compliance and 83% compliance for greater than 4 hours usage.  Average usage 5 hours and 39 minutes.  Residual AHI 0.7 with mean pressure 8 and max pressure 13 with EPR level 2.  Pressure in the 95th percentile 11.7.  Leaks in the 95th percentile 24.1. No CPAP related concerns -tolerating well.  No further concerns at this time   Update 10/27/2020 JM: Mr. Speakman returns for 36-month stroke follow-up accompanied by [facility] interpreter.  He has been doing well since prior visit reporting complete recovery of prior deficits and denies new stroke/TIA symptoms.  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  Compliant on aspirin and atorvastatin 80 mg daily without associated side effects.  Recent lipid panel by PCP showed continued elevated LDL 118 therefore added Zetia 10 mg daily.  Blood pressure today 146/88 on lisinopril-hydrochlorothiazide per PCP. Monitors at home and typically stable - does report occasional elevated levels at home but typically checks BP in the AM prior to taking his BP medication -usually stable during. Use of CPAP nightly.  No further concerns at this  time.   Initial visit 06/26/2020 JM: Mr. Meester is being seen for hospital follow-up accompanied by his daughter who assists with interpretation.  Residual imbalance more so towards the left side (or leaning towards left) but overall improvement since discharge He will occasionally have vertigo when laying in bed but this has greatly improvement.  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 male with medical history significant for hypertension and OSA on CPAP, and recently seen by rheumatology for positive ANA.  He presented to [facility] ED on 05/28/2020 for  acute onset of dizziness associated with nausea and vomiting and found to have significant nystagmus and truncal lean upon exam as well as Covid positive.  Personally reviewed hospitalization pertinent progress notes, lab work and imaging with summary provided.  Evaluated by Dr. 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  Recommended DAPT for 3 weeks and aspirin alone.  LDL 136 and initiate atorvastatin 80 mg daily.  Other stroke risk factors include HTN, asymptomatic COVID infection, significant family history of stroke (mother and sibling died from stroke and remaining brother significant paralysis s/p stroke) and OSA on CPAP.  No  personal prior history of stroke.  Evaluated by therapies and recommended OP PT/OT discharged home in stable condition.    Stroke - Midbrain stroke likely related to SVD Code Stroke CT ASPECTS 10.  CTA head & neck: Normal head CT.  No acute intracranial abnormality identified.  MRI: Subtle 8 mm ill-defined diffusion signal abnormality involving the central aspect of the midbrain, suspicious for possible acute small vessel ischemia given the patient's history. No associated hemorrhage or mass effect. Carotid Doppler unremarkable 2D Echo EF 60-65% LDL  136 HgbA1c 5.6 Covid testing positive UDS negative VTE prophylaxis is recommended.  Passed bedside swallow eval. On heart healthy diet.  Recommend aspirin and Plavix DAPT for 3 weeks and then aspirin alone. Therapy recommendations:  CIR --> OP PT/OT Disposition:  home    ROS:   14 system review of systems performed and negative with exception of those listed in HPI  PMH:  Past Medical History:  Diagnosis Date   Elevated BP    Hypertension    Sinus congestion    Sleep apnea    does not use a cpap   Stroke (HCC)     PSH:  Past Surgical History:  Procedure Laterality Date   COLONOSCOPY  2018   Reportedly normal. Repeat recommended in 10 years.   HERNIA REPAIR  1980s   inguinal    NASAL SEPTOPLASTY W/ TURBINOPLASTY Bilateral 07/11/2014   Procedure: NASAL SEPTOPLASTY WITH BILATERAL INFERIOR TURBINATE REDUCTION;  Surgeon: Osborn Coho, MD;  Location: [facility];  Service: ENT;  Laterality: Bilateral;   SINUS ENDO W/FUSION Bilateral 07/11/2014   Procedure: BILATERAL ENDOSCOPIC SINUS SURGERY/RESECTION OF INTRA NASAL POLYPS WITH FUSION SCAN;  Surgeon: Osborn Coho, MD;  Location: [facility];  Service: ENT;  Laterality: Bilateral;    Social History:  Social History   Socioeconomic History   Marital status: Married    Spouse name: [name]   Number of children: Not on file   Years of education:  Not on file   Highest education level: Not on file  Occupational History   Not on file  Tobacco Use   Smoking status: Never   Smokeless tobacco: Never  Vaping Use   Vaping Use: Never used  Substance and Sexual Activity   Alcohol use: Yes    Comment: rarely   Drug use: No   Sexual activity: Not on file  Other Topics Concern   Not on file  Social History Narrative   Original from [place]         Social Determinants of Health   Financial Resource Strain: Not on file  Food Insecurity: Not on file  Transportation Needs: Not on file  Physical Activity: Not on file  Stress: Not on file  Social Connections: Not on file  Intimate Partner Violence: Not on file    Family History:  Family  History  Problem Relation Age of Onset   Hypertension Other        M, B   Hypertension Mother     Medications:   Current Outpatient Medications on File Prior to Visit  Medication Sig Dispense Refill   amLODipine-valsartan (EXFORGE) 10-320 MG tablet TAKE 1 TABLET BY MOUTH DAILY. 90 tablet 2   aspirin EC 81 MG EC tablet Take 1 tablet (81 mg total) by mouth daily. Swallow whole. 30 tablet 11   atorvastatin (LIPITOR) 80 MG tablet TAKE 1 TABLET (80 MG TOTAL) BY MOUTH DAILY. 90 tablet 1   hydrochlorothiazide (HYDRODIURIL) 25 MG tablet Take 1 tablet (25 mg total) by mouth daily. 90 tablet 3   No current facility-administered medications on file prior to visit.    Allergies:  No Known Allergies    OBJECTIVE:  Physical Exam  Vitals:   05/10/22 1246  BP: 127/76  Pulse: 73  Weight: 188 lb (85.3 kg)  Height: 5\' 6"  (1.676 m)   Body mass index is 30.34 kg/m. No results found.  General: well developed, well nourished, pleasant elderly male, seated, in no evident distress Head: head normocephalic and atraumatic.   Neck: supple with no carotid or supraclavicular bruits Cardiovascular: regular rate and rhythm, no murmurs Musculoskeletal: no deformity Skin:  no  rash/petichiae Vascular:  Normal pulses all extremities   Neurologic Exam Mental Status: Awake and fully alert. Denies dysarthria or aphasia. Primary language of Spanish with some broken [place]. Oriented to place and time. Recent and remote memory intact. Attention span, concentration and fund of knowledge appropriate during visit. Mood and affect appropriate.  Cranial Nerves: Pupils equal, briskly reactive to light. Extraocular movements full without nystagmus. Visual fields full to confrontation. Hearing intact. Facial sensation intact. Face, tongue, palate moves normally and symmetrically.  Motor: Normal bulk and tone. Normal strength in all tested extremity muscles Sensory.: intact to touch , pinprick , position and vibratory sensation.  Negative Tinel and Phalen sign Coordination: Rapid alternating movements normal in all extremities. Finger-to-nose and heel-to-shin performed accurately bilaterally. Gait and Station: Arises from chair without difficulty. Stance is normal. Gait demonstrates normal stride length and balance without use of assistive device.  Able to perform tandem walk and heel toe with only slight difficulty  Reflexes: 1+ and symmetric. Toes downgoing.         ASSESSMENT: Manuel Peters is a 62 y.o. 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  Vascular risk factors include HTN, HLD, OSA on CPAP and significant family history of stroke.  Returns today due to recent imaging in [place] showing "clogged arteries" (unsure location) which was not previously seen on imaging, also c/o intermittent left hand numbness over the past month and difficulty with CPAP tolerance    PLAN:  Intracranial stenosis: ?  Carotid stenosis Recommend repeat CTA head/neck to re-evaluate and compare to prior imaging in 05/2020 No new stroke/TIA symptoms  Will repeat lipid panel today, if LDL remains  elevated, will need to consider alternative HLD management  Hx of Midbrain stroke:  Residual deficit: Mild imbalance. Stable.  MR brain 05/2021 (for worsening imbalance) no acute findings or new concerns since prior imaging Continue aspirin 81 mg daily  and atorvastatin 80 mg daily for secondary stroke prevention.   Discussed secondary stroke prevention measures and importance of close PCP follow up for aggressive stroke risk factor management including BP goal<130/90, and HLD with LDL goal<70  OSA on CPAP: previously followed by [facility] neurology with HST 10/2019 and started on CPAP 12/2019. Requested transfer to this office due to insurance coverage.  Reports more recent difficulty tolerating CPAP mask, will place order to obtain new mask fitting.  Discussed ways to help with desensitization and intolerance.  Discussed importance of nightly CPAP usage.  If he continues to have difficulty tolerating, will place referral to be seen by one of our sleep specialists to further discuss candidacy for inspire device.   Left hand numbness: Suspect in setting of mild carpal tunnel syndrome.  Discussed use of carpal tunnel brace at night or with any movement/activity that causes symptoms. If symptoms persist or worsen, can consider proceeding with EMG/NCV    Follow-up in 6 months or call earlier if needed    CC:  Rudd, Bertram Millard, MD    I spent 38 minutes of face-to-face and non-face-to-face time with patient and wife assisted by [facility] interpreter.  This included previsit chart review, lab review, study review, order entry, electronic health record documentation, patient education and discussion regarding above diagnoses and treatment plan and answered all other questions to patient and wife satisfaction   Ihor Austin, AGNP-BC  [facility] [address] [place], [place] [address]  Phone [phone] Fax [phone] Note: This document was prepared with  digital dictation and possible smart phrase technology. Any transcriptional errors that result from this process are unintentional.

## 2022-05-11 LAB — LIPID PANEL
Chol/HDL Ratio: 4.3 ratio (ref 0.0–5.0)
Cholesterol, Total: 149 mg/dL (ref 100–199)
HDL: 35 mg/dL — ABNORMAL LOW (ref >39)
LDL Chol Calc (NIH): 79 mg/dL (ref 0–99)
Triglycerides: 208 mg/dL — ABNORMAL HIGH (ref 0–149)
VLDL Cholesterol Cal: 35 mg/dL (ref 5–40)

## 2022-05-12 ENCOUNTER — Telehealth: Payer: Self-pay | Admitting: Adult Health

## 2022-05-12 NOTE — Telephone Encounter (Signed)
Dillon Bjork: [id number] exp. 05/11/22-06/09/22 sent to [place]. [place] [address] [phone] This was the only location his plan would let me choose as the facility in network.

## 2022-11-08 ENCOUNTER — Ambulatory Visit: Payer: BLUE CROSS/BLUE SHIELD | Admitting: Adult Health

## 2022-11-18 ENCOUNTER — Ambulatory Visit: Payer: 59 | Admitting: Adult Health

## 2023-02-03 ENCOUNTER — Ambulatory Visit: Payer: BLUE CROSS/BLUE SHIELD | Admitting: Family Medicine

## 2023-10-17 ENCOUNTER — Ambulatory Visit: Admitting: Family Medicine
